# Patient Record
Sex: Female | Born: 1985 | Race: Black or African American | Hispanic: No | Marital: Single | Smoking: Never smoker
Health system: Southern US, Community
[De-identification: ages and names within clinical notes are randomized; demographics above are authoritative.]

## PROBLEM LIST (undated history)

## (undated) DIAGNOSIS — Z87448 Personal history of other diseases of urinary system: Secondary | ICD-10-CM

## (undated) DIAGNOSIS — D649 Anemia, unspecified: Secondary | ICD-10-CM

## (undated) DIAGNOSIS — N6320 Unspecified lump in the left breast, unspecified quadrant: Secondary | ICD-10-CM

## (undated) DIAGNOSIS — B009 Herpesviral infection, unspecified: Secondary | ICD-10-CM

## (undated) DIAGNOSIS — Z862 Personal history of diseases of the blood and blood-forming organs and certain disorders involving the immune mechanism: Secondary | ICD-10-CM

## (undated) DIAGNOSIS — N760 Acute vaginitis: Secondary | ICD-10-CM

## (undated) DIAGNOSIS — J988 Other specified respiratory disorders: Secondary | ICD-10-CM

## (undated) HISTORY — DX: Acute vaginitis: N76.0

## (undated) HISTORY — DX: Unspecified lump in the left breast, unspecified quadrant: N63.20

## (undated) HISTORY — DX: Other specified respiratory disorders: J98.8

## (undated) HISTORY — PX: TYMPANOSTOMY TUBE PLACEMENT: SHX32

## (undated) HISTORY — DX: Herpesviral infection, unspecified: B00.9

## (undated) HISTORY — DX: Personal history of diseases of the blood and blood-forming organs and certain disorders involving the immune mechanism: Z86.2

## (undated) HISTORY — DX: Personal history of other diseases of urinary system: Z87.448

## (undated) HISTORY — DX: Anemia, unspecified: D64.9

## (undated) HISTORY — PX: OTHER SURGICAL HISTORY: SHX169

---
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
## 2011-04-15 ENCOUNTER — Other Ambulatory Visit: Payer: Self-pay | Admitting: Obstetrics and Gynecology

## 2011-04-15 DIAGNOSIS — N6320 Unspecified lump in the left breast, unspecified quadrant: Secondary | ICD-10-CM

## 2011-04-17 ENCOUNTER — Ambulatory Visit
Admission: RE | Admit: 2011-04-17 | Discharge: 2011-04-17 | Disposition: A | Discharge status: Home / Self Care | Payer: BC Managed Care – PPO | Source: Ambulatory Visit | Attending: Obstetrics and Gynecology | Admitting: Obstetrics and Gynecology

## 2011-04-17 ENCOUNTER — Other Ambulatory Visit: Payer: Self-pay

## 2011-04-17 DIAGNOSIS — N6320 Unspecified lump in the left breast, unspecified quadrant: Secondary | ICD-10-CM

## 2012-04-11 ENCOUNTER — Telehealth: Payer: Self-pay | Admitting: Obstetrics and Gynecology

## 2012-04-12 ENCOUNTER — Telehealth: Payer: Self-pay

## 2012-04-12 NOTE — Telephone Encounter (Signed)
vph pt 

## 2012-04-12 NOTE — Telephone Encounter (Addendum)
Lm on vm for pt to call back   Pt states she is having another period after 16 days. Advised pt that since she is not taking any type of birth control that she may experience irregular bleeding and to keep track of how often she is having periods. Pt has annual appointment scheduled 05/02/12 with Dr Pennie Rushing. Advised pt to keep scheduled appointment and to call office back if bleeding becomes heavier or if she experiences any changes different from her normal cycle. Pt voiced understanding.

## 2012-04-13 NOTE — Telephone Encounter (Signed)
Tc to pt per telephone call. Pt took a upt approx 3-4 days ago=negative. Pt informed to keep appt on 05/02/12 for eval. Pt to call back if bldg increases(changing >1pad hourly), fever, or abd pain that doesn't subside with otc pain meds occur. Pt voices understanding.

## 2012-05-02 ENCOUNTER — Encounter: Payer: Self-pay | Admitting: Obstetrics and Gynecology

## 2012-05-02 ENCOUNTER — Other Ambulatory Visit: Payer: Self-pay | Admitting: Obstetrics and Gynecology

## 2012-05-02 ENCOUNTER — Ambulatory Visit (INDEPENDENT_AMBULATORY_CARE_PROVIDER_SITE_OTHER): Payer: BC Managed Care – PPO | Admitting: Obstetrics and Gynecology

## 2012-05-02 VITALS — BP 84/60 | HR 90 | Ht 58.5 in | Wt 88.0 lb

## 2012-05-02 DIAGNOSIS — N926 Irregular menstruation, unspecified: Secondary | ICD-10-CM

## 2012-05-02 DIAGNOSIS — Z124 Encounter for screening for malignant neoplasm of cervix: Secondary | ICD-10-CM

## 2012-05-02 DIAGNOSIS — N898 Other specified noninflammatory disorders of vagina: Secondary | ICD-10-CM

## 2012-05-02 DIAGNOSIS — Z113 Encounter for screening for infections with a predominantly sexual mode of transmission: Secondary | ICD-10-CM

## 2012-05-02 DIAGNOSIS — N760 Acute vaginitis: Secondary | ICD-10-CM | POA: Insufficient documentation

## 2012-05-02 LAB — POCT WET PREP (WET MOUNT): Clue Cells Wet Prep Whiff POC: POSITIVE

## 2012-05-02 LAB — POCT OSOM BVBLUE TEST: Bacterial Vaginosis: POSITIVE

## 2012-05-02 LAB — HEPATITIS B SURFACE ANTIGEN: Hepatitis B Surface Ag: NEGATIVE

## 2012-05-02 LAB — HIV ANTIBODY (ROUTINE TESTING W REFLEX): HIV: NONREACTIVE

## 2012-05-02 MED ORDER — METRONIDAZOLE 0.75 % VA GEL: VAGINAL | Status: DC

## 2012-05-02 MED ORDER — TERCONAZOLE 0.4 % VA CREA: TOPICAL_CREAM | VAGINAL | Status: DC

## 2012-05-02 MED ORDER — FLUCONAZOLE 100 MG PO TABS: ORAL_TABLET | ORAL | Status: DC

## 2012-05-02 NOTE — Patient Instructions (Signed)
Pamphlet for [facility] & MIRENA

## 2012-05-02 NOTE — Progress Notes (Signed)
AEX  Last Pap: 04/08/10, pap due today per last visit note WNL: Yes.  No hx abnl Regular Periods:no Pt states she had 2 periods within 2 weeks, 03/26/12 and 04/10/12 Contraception: none  Monthly Breast exam:yes Tetanus<92yrs:yes Nl.Bladder Function:yes Daily BMs:no Healthy Diet:yes Calcium:no Mammogram:no pt has previously had breast u/s Date of Mammogram: n/a Exercise:yes Have often Exercise: 2-3 times per week  Seatbelt: yes Abuse at home: no Stressful work:yes Sigmoid-colonoscopy: n/a Bone Density: No PCP: [facility] Change in PMH: none Change in UXL:KGMW . Subjective:   Abigail Benton is a 26 y.o. female, G0P0000, who presents for an annual exam. Wants IUD for contraceptiont.  Had irreg menses once.Pt c/o itching and discomfort after intercourse.      History   Social History  . Marital Status: Single    Spouse Name: N/A    Number of Children: N/A  . Years of Education: N/A   Social History Main Topics  . Smoking status: Never Smoker   . Smokeless tobacco: None  . Alcohol Use: No  . Drug Use: No  . Sexually Active: Yes    Birth Control/ Protection: None   Other Topics Concern  . None   Social History Narrative  . None    Menstrual cycle:   LMP: Patient's last menstrual period was 04/10/2012.           Cycle: see above.  Usually q28d  The following portions of the patient's history were reviewed and updated as appropriate: allergies, current medications, past family history, past medical history, past social history, past surgical history and problem list.  Review of Systems Pertinent items are noted in HPI. Breast:Negative for breast lump,nipple discharge or nipple retraction Gastrointestinal: Negative for abdominal pain, change in bowel habits or rectal bleeding Urinary:negative   Objective:    BP 84/60  Pulse 90  Ht 4' 10.5" (1.486 m)  Wt 88 lb (39.917 kg)  BMI 18.08 kg/m2  LMP 04/10/2012    Weight:  Wt Readings from Last 1  Encounters:  05/02/12 88 lb (39.917 kg)          BMI: Body mass index is 18.08 kg/(m^2).  General Appearance: Alert, appropriate appearance for age. No acute distress HEENT: Grossly normal Neck / Thyroid: Supple, no masses, nodes or enlargement Lungs: clear to auscultation bilaterally Back: No CVA tenderness Breast Exam: bilateral fibrocystic changes. L>R.  Know L axillary node on prior US Cardiovascular: Regular rate and rhythm. S1, S2, no murmur Gastrointestinal: Soft, non-tender, no masses or organomegaly Pelvic Exam: External genitalia: normal general appearance Vaginal: normal mucosa without prolapse or lesions and discharge, white and copious Cervix: normal appearance and contact bleeding Adnexa: normal bimanual exam Uterus: normal single, nontender Rectovaginal: normal rectal, no masses Lymphatic Exam: Non-palpable nodes in neck, clavicular, axillary, or inguinal regions Skin: no rash or abnormalities Neurologic: Normal gait and speech, no tremor  Psychiatric: Alert and oriented, appropriate affect.   Wet Prep:positive clue cells, pH 4.5 and positive whiff test osom bv:  Pos osom trich: Neg Urinalysis:not applicable UPT: Negative   Assessment:    BV  Menstrual irregularity only once.  Plan:    pap smear additional lab tests per orders with TSH return annually or prn STD screening: done Contraception:Wants to consider IUD.  Info re Christean Grief and Mirena given Metro[facility]l followed by Denna Haggard PMD

## 2012-05-03 LAB — HSV 2 ANTIBODY, IGG: HSV 2 Glycoprotein G Ab, IgG: <0.1 IV

## 2012-05-03 LAB — HSV 1 ANTIBODY, IGG: HSV 1 Glycoprotein G Ab, IgG: 8.81 IV — ABNORMAL HIGH

## 2012-05-19 ENCOUNTER — Ambulatory Visit (INDEPENDENT_AMBULATORY_CARE_PROVIDER_SITE_OTHER): Payer: BC Managed Care – PPO | Admitting: Obstetrics and Gynecology

## 2012-05-19 ENCOUNTER — Encounter: Payer: Self-pay | Admitting: Obstetrics and Gynecology

## 2012-05-19 VITALS — BP 92/56 | Ht 58.5 in | Wt 90.0 lb

## 2012-05-19 DIAGNOSIS — Z309 Encounter for contraceptive management, unspecified: Secondary | ICD-10-CM

## 2012-05-19 DIAGNOSIS — Z30430 Encounter for insertion of intrauterine contraceptive device: Secondary | ICD-10-CM

## 2012-05-19 LAB — POCT URINE PREGNANCY: Preg Test, Ur: NEGATIVE

## 2012-05-19 NOTE — Progress Notes (Signed)
[name] Insertion:  IUD: [name] LOT#: [id number]  Exp: 02/2013 UPT: negative GC/CHLAMYDIA: negative CONSENT SIGNED: yes DISINFECTION WITH Betadine X3 UTERUS SOUNDED AT 8 CM IUD INSERTED PER PROTOCOL: yes COMPLICATION: none PATIENT INSTRUCTED TO CALL IS FEVER OR ABNORMAL PAIN:yes PATIENT INSTRUCTED ON HOW TO CHECK IUD STRINGS: yes FOLLOW UP APPT: 6wks  800 mg Ibuprofen Given.   Dierdre Forth MD

## 2012-05-20 MED ORDER — LEVONORGESTREL 13.5 MG IU IUD
1.0000 | INTRAUTERINE_SYSTEM | Freq: Once | INTRAUTERINE | Status: AC
  Administered 2012-05-20: 1 via INTRAUTERINE

## 2012-06-07 ENCOUNTER — Telehealth: Payer: Self-pay | Admitting: Obstetrics and Gynecology

## 2012-06-07 NOTE — Telephone Encounter (Signed)
Triage/res. °

## 2012-06-07 NOTE — Telephone Encounter (Signed)
Pt calling for STD results. 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 Advised pt that all other tests were negative. Pt voiced understanding.

## 2012-06-30 ENCOUNTER — Ambulatory Visit (INDEPENDENT_AMBULATORY_CARE_PROVIDER_SITE_OTHER): Payer: BC Managed Care – PPO | Admitting: Obstetrics and Gynecology

## 2012-06-30 ENCOUNTER — Encounter: Payer: Self-pay | Admitting: Obstetrics and Gynecology

## 2012-06-30 VITALS — BP 98/58 | Ht 58.5 in | Wt 93.0 lb

## 2012-06-30 DIAGNOSIS — N939 Abnormal uterine and vaginal bleeding, unspecified: Secondary | ICD-10-CM

## 2012-06-30 NOTE — Progress Notes (Signed)
SKYLA F/U INSERTED ON 05/19/12  Complaints: Not able to time period. No further bleeding for the last several weeks.  Has cramps off and on relieved by Aleve just before the onset of this cycle Strings Visualized:yes Normal Bimanual:yes Other: NO C/O except as above Follow up with AEX:yes Other: instructed to call for unusual abdominal pain or bleeding after 3 months

## 2012-12-30 ENCOUNTER — Other Ambulatory Visit: Payer: Self-pay | Admitting: Obstetrics and Gynecology

## 2013-12-28 ENCOUNTER — Ambulatory Visit (INDEPENDENT_AMBULATORY_CARE_PROVIDER_SITE_OTHER): Payer: BC Managed Care – PPO | Admitting: Nurse Practitioner

## 2013-12-28 ENCOUNTER — Encounter (INDEPENDENT_AMBULATORY_CARE_PROVIDER_SITE_OTHER): Payer: Self-pay

## 2013-12-28 ENCOUNTER — Encounter: Payer: Self-pay | Admitting: Nurse Practitioner

## 2013-12-28 VITALS — BP 110/70 | HR 85 | Temp 98.3°F | Ht <= 58 in | Wt 96.8 lb

## 2013-12-28 DIAGNOSIS — N390 Urinary tract infection, site not specified: Secondary | ICD-10-CM

## 2013-12-28 DIAGNOSIS — R319 Hematuria, unspecified: Secondary | ICD-10-CM

## 2013-12-28 LAB — POCT UA - MICROSCOPIC ONLY
Bacteria, U Microscopic: NEGATIVE
CRYSTALS, UR, HPF, POC: NEGATIVE
Casts, Ur, LPF, POC: NEGATIVE
Mucus, UA: NEGATIVE
Yeast, UA: NEGATIVE

## 2013-12-28 LAB — POCT URINALYSIS DIPSTICK
Bilirubin, UA: NEGATIVE
GLUCOSE UA: NEGATIVE
Ketones, UA: NEGATIVE
NITRITE UA: NEGATIVE
Protein, UA: NEGATIVE
Spec Grav, UA: 1.01
UROBILINOGEN UA: NEGATIVE
pH, UA: 7.5

## 2013-12-28 MED ORDER — NITROFURANTOIN MONOHYD MACRO 100 MG PO CAPS: 100.0000 mg | ORAL_CAPSULE | Freq: Two times a day (BID) | ORAL | Status: DC

## 2013-12-28 NOTE — Progress Notes (Signed)
   Subjective:    Patient ID: Abigail MaidensBrianna J Benton, female    DOB: 03-27-1986, 28 y.o.   MRN: [id number]  HPI Patient in c/o discoloration of urine- started Tuesday- No dysuria or frequency-    Review of Systems  Genitourinary: Positive for urgency, frequency and flank pain. Negative for dysuria.  All other systems reviewed and are negative.       Objective:   Physical Exam  Constitutional: She appears well-developed and well-nourished.  Cardiovascular: Normal rate, regular rhythm and normal heart sounds.   Pulmonary/Chest: Effort normal and breath sounds normal.  Abdominal: Soft.  Skin: Skin is warm and dry.  Psychiatric: She has a normal mood and affect. Her behavior is normal. Judgment and thought content normal.    BP 110/70  Pulse 85  Temp(Src) 98.3 F (36.8 C) (Oral)  Ht 4\' 10"  (1.473 m)  Wt 96 lb 12.8 oz (43.908 kg)  BMI 20.24 kg/m2  LMP 12/22/2013 Results for orders placed in visit on 12/28/13  POCT URINALYSIS DIPSTICK      Result Value Ref Range   Color, UA straw     Clarity, UA cloudy     Glucose, UA neg     Bilirubin, UA neg     Ketones, UA neg     Spec Grav, UA 1.010     Blood, UA large     pH, UA 7.5     Protein, UA neg     Urobilinogen, UA negative     Nitrite, UA neg     Leukocytes, UA moderate (2+)    POCT UA - MICROSCOPIC ONLY      Result Value Ref Range   WBC, Ur, HPF, POC 10-15     RBC, urine, microscopic 15-20     Bacteria, U Microscopic neg     Mucus, UA neg     Epithelial cells, urine per micros occ     Crystals, Ur, HPF, POC neg     Casts, Ur, LPF, POC neg     Yeast, UA neg           Assessment & Plan:   1. Hematuria   2. UTI (urinary tract infection)    Meds ordered this encounter  Medications  . drospirenone-ethinyl estradiol (YAZ,GIANVI,LORYNA) 3-0.02 MG tablet    Sig: Take 1 tablet by mouth daily.  . nitrofurantoin, macrocrystal-monohydrate, (MACROBID) 100 MG capsule    Sig: Take 1 capsule (100 mg total) by mouth 2  (two) times daily.    Dispense:  14 capsule    Refill:  0    Order Specific Question:  Supervising Provider    Answer:  Deborra MedinaMOORE, DONALD W [[id number]]   Force fluids AZO over the counter X2 days RTO prn Culture pending  Mary-Margaret Daphine DeutscherMartin, FNP

## 2013-12-28 NOTE — Patient Instructions (Signed)
Urinary Tract Infection  Urinary tract infections (UTIs) can develop anywhere along your urinary tract. Your urinary tract is your body's drainage system for removing wastes and extra water. Your urinary tract includes two kidneys, two ureters, a bladder, and a urethra. Your kidneys are a pair of bean-shaped organs. Each kidney is about the size of your fist. They are located below your ribs, one on each side of your spine.  CAUSES  Infections are caused by microbes, which are microscopic organisms, including fungi, viruses, and bacteria. These organisms are so small that they can only be seen through a microscope. Bacteria are the microbes that most commonly cause UTIs.  SYMPTOMS   Symptoms of UTIs may vary by age and gender of the patient and by the location of the infection. Symptoms in young women typically include a frequent and intense urge to urinate and a painful, burning feeling in the bladder or urethra during urination. Older women and men are more likely to be tired, shaky, and weak and have muscle aches and abdominal pain. A fever may mean the infection is in your kidneys. Other symptoms of a kidney infection include pain in your back or sides below the ribs, nausea, and vomiting.  DIAGNOSIS  To diagnose a UTI, your caregiver will ask you about your symptoms. Your caregiver also will ask to provide a urine sample. The urine sample will be tested for bacteria and white blood cells. White blood cells are made by your body to help fight infection.  TREATMENT   Typically, UTIs can be treated with medication. Because most UTIs are caused by a bacterial infection, they usually can be treated with the use of antibiotics. The choice of antibiotic and length of treatment depend on your symptoms and the type of bacteria causing your infection.  HOME CARE INSTRUCTIONS   If you were prescribed antibiotics, take them exactly as your caregiver instructs you. Finish the medication even if you feel better after you  have only taken some of the medication.   Drink enough water and fluids to keep your urine clear or pale yellow.   Avoid caffeine, tea, and carbonated beverages. They tend to irritate your bladder.   Empty your bladder often. Avoid holding urine for long periods of time.   Empty your bladder before and after sexual intercourse.   After a bowel movement, women should cleanse from front to back. Use each tissue only once.  SEEK MEDICAL CARE IF:    You have back pain.   You develop a fever.   Your symptoms do not begin to resolve within 3 days.  SEEK IMMEDIATE MEDICAL CARE IF:    You have severe back pain or lower abdominal pain.   You develop chills.   You have nausea or vomiting.   You have continued burning or discomfort with urination.  MAKE SURE YOU:    Understand these instructions.   Will watch your condition.   Will get help right away if you are not doing well or get worse.  Document Released: 07/01/2005 Document Revised: 03/22/2012 Document Reviewed: 10/30/2011  [employer] Patient Information 2014 [employer].

## 2014-01-09 ENCOUNTER — Encounter: Payer: Self-pay | Admitting: Family Medicine

## 2014-01-09 ENCOUNTER — Ambulatory Visit (INDEPENDENT_AMBULATORY_CARE_PROVIDER_SITE_OTHER): Payer: BC Managed Care – PPO | Admitting: Family Medicine

## 2014-01-09 VITALS — BP 92/56 | HR 77 | Temp 97.8°F | Ht <= 58 in | Wt 93.6 lb

## 2014-01-09 DIAGNOSIS — R350 Frequency of micturition: Secondary | ICD-10-CM

## 2014-01-09 DIAGNOSIS — IMO0001 Reserved for inherently not codable concepts without codable children: Secondary | ICD-10-CM | POA: Insufficient documentation

## 2014-01-09 DIAGNOSIS — N390 Urinary tract infection, site not specified: Secondary | ICD-10-CM

## 2014-01-09 LAB — POCT URINALYSIS DIPSTICK
Bilirubin, UA: NEGATIVE
Blood, UA: NEGATIVE
Glucose, UA: NEGATIVE
Ketones, UA: NEGATIVE
Nitrite, UA: NEGATIVE
Spec Grav, UA: 1.01
Urobilinogen, UA: NEGATIVE
pH, UA: 6

## 2014-01-09 LAB — POCT UA - MICROSCOPIC ONLY
Casts, Ur, LPF, POC: NEGATIVE
Crystals, Ur, HPF, POC: NEGATIVE
Mucus, UA: NEGATIVE
Yeast, UA: NEGATIVE

## 2014-01-09 NOTE — Addendum Note (Signed)
Addended by: Orma RenderHODGES, Pra[place] Stenberg F on: 01/09/2014 04:24 PM   Modules accepted: Orders

## 2014-01-09 NOTE — Progress Notes (Signed)
Patient ID: Abigail Benton, female   DOB: 27-Nov-1985, 27 y.o.   MRN: [id number] SUBJECTIVE: CC: Chief Complaint  Patient presents with  . Follow-up    follow up UTI that was initially treated by Sheron Nightingale, NP stated has no UTI symptoms wants to make sure it is cleared . states saw her gyn last week  and had STD and was told "clear " and l"labs drawn and normal " per pt    HPI: Has had a UTI almost 2 weeks ago. But only took 3 doses of nitrofurantoin and it made her vomit and she was intolerant. No UTI symptoms but was told to come in to check urine for clearance. She is  Sexually active without use of barrier method. No vaginal discharge. No STIs. She had her GYN exam last week with STI screens  Negative.  Past Medical History  Diagnosis Date  . Left breast mass     stable in 04/2011  . Infection, respiratory     2011  . Vulvovaginitis   . H/O hematuria   . H/O sickle cell trait   . Anemia   . HSV-1 (herpes simplex virus 1) infection    Past Surgical History  Procedure Laterality Date  . Tympanostomy tube placement      bilateral   History   Social History  . Marital Status: Single    Spouse Name: N/A    Number of Children: N/A  . Years of Education: N/A   Occupational History  . Not on file.   Social History Main Topics  . Smoking status: Never Smoker   . Smokeless tobacco: Never Used  . Alcohol Use: No  . Drug Use: No  . Sexual Activity: Yes    Birth Control/ Protection: IUD     Comment: skylar   Other Topics Concern  . Not on file   Social History Narrative  . No narrative on file   Family History  Problem Relation Age of Onset  . Cancer Maternal Grandmother     breast  . Cancer Maternal Aunt     breast  . Cancer Maternal Grandfather    Current Outpatient Prescriptions on File Prior to Visit  Medication Sig Dispense Refill  . drospirenone-ethinyl estradiol (YAZ,GIANVI,LORYNA) 3-0.02 MG tablet Take 1 tablet by mouth daily.      . nitrofurantoin,  macrocrystal-monohydrate, (MACROBID) 100 MG capsule Take 1 capsule (100 mg total) by mouth 2 (two) times daily.  14 capsule  0   No current facility-administered medications on file prior to visit.   Allergies  Allergen Reactions  . Macrobid [employer] Macro]   . Latex Rash    There is no immunization history on file for this patient. Prior to Admission medications   Medication Sig Start Date End Date Taking? Authorizing Provider  drospirenone-ethinyl estradiol (YAZ,GIANVI,LORYNA) 3-0.02 MG tablet Take 1 tablet by mouth daily.    Historical Provider, MD  nitrofurantoin, macrocrystal-monohydrate, (MACROBID) 100 MG capsule Take 1 capsule (100 mg total) by mouth 2 (two) times daily. 12/28/13   Mary-Margaret Daphine Deutscher, FNP     ROS: As above in the HPI. All other systems are stable or negative.  OBJECTIVE: APPEARANCE:  Patient in no acute distress.The patient appeared well nourished and normally developed. Acyanotic. Waist: VITAL SIGNS:BP 92/56  Pulse 77  Temp(Src) 97.8 F (36.6 C) (Oral)  Ht 4\' 10"  (1.473 m)  Wt 93 lb 9.6 oz (42.457 kg)  BMI 19.57 kg/m2  LMP 12/29/2013   SKIN: warm and  Dry without overt rashes, tattoos and scars  HEAD and Neck: without JVD, Head and scalp: normal Eyes:No scleral icterus. Fundi normal, eye movements normal. Ears: Auricle normal, canal normal, Tympanic membranes normal, insufflation normal. Nose: normal Throat: normal Neck & thyroid: normal  CHEST & LUNGS: Chest wall: normal Lungs: Clear  CVS: Reveals the PMI to be normally located. Regular rhythm, First and Second Heart sounds are normal,  absence of murmurs, rubs or gallops. Peripheral vasculature: Radial pulses: normal Dorsal pedis pulses: normal Posterior pulses: normal  ABDOMEN:  Appearance: normal Benign, no organomegaly, no masses, no Abdominal Aortic enlargement. No Guarding , no rebound. No Bruits. Bowel sounds: normal  RECTAL: N/A GU: N/A  EXTREMETIES:  nonedematous.  MUSCULOSKELETAL:  Spine: normal Joints: intact  NEUROLOGIC: oriented to time,place and person; nonfocal. Strength is normal Sensory is normal Reflexes are normal Cranial Nerves are normal.  ASSESSMENT: Frequency - Plan: POCT UA - Microscopic Only, POCT urinalysis dipstick  UTI (urinary tract infection)  PLAN: Results for orders placed in visit on 12/28/13  POCT URINALYSIS DIPSTICK      Result Value Ref Range   Color, UA straw     Clarity, UA cloudy     Glucose, UA neg     Bilirubin, UA neg     Ketones, UA neg     Spec Grav, UA 1.010     Blood, UA large     pH, UA 7.5     Protein, UA neg     Urobilinogen, UA negative     Nitrite, UA neg     Leukocytes, UA moderate (2+)    POCT UA - MICROSCOPIC ONLY      Result Value Ref Range   WBC, Ur, HPF, POC 10-15     RBC, urine, microscopic 15-20     Bacteria, U Microscopic neg     Mucus, UA neg     Epithelial cells, urine per micros occ     Crystals, Ur, HPF, POC neg     Casts, Ur, LPF, POC neg     Yeast, UA neg      Orders Placed This Encounter  Procedures  . POCT UA - Microscopic Only  . POCT urinalysis dipstick   No orders of the defined types were placed in this encounter.   There are no discontinued medications. Return if symptoms worsen or fail to improve, for pending Urine culture. Urine culture ordered in lab.  Ayo Guarino P. Modesto CharonWong, M.D.

## 2014-01-11 LAB — URINE CULTURE: Organism ID, Bacteria: NO GROWTH

## 2014-01-14 ENCOUNTER — Other Ambulatory Visit: Payer: Self-pay | Admitting: Family Medicine

## 2014-01-14 DIAGNOSIS — N390 Urinary tract infection, site not specified: Secondary | ICD-10-CM

## 2015-06-17 ENCOUNTER — Other Ambulatory Visit: Payer: Self-pay | Admitting: Obstetrics and Gynecology

## 2015-06-17 DIAGNOSIS — N6310 Unspecified lump in the right breast, unspecified quadrant: Secondary | ICD-10-CM

## 2015-06-19 ENCOUNTER — Ambulatory Visit
Admission: RE | Admit: 2015-06-19 | Discharge: 2015-06-19 | Disposition: A | Discharge status: Home / Self Care | Payer: BC Managed Care – PPO | Source: Ambulatory Visit | Attending: Obstetrics and Gynecology | Admitting: Obstetrics and Gynecology

## 2015-06-19 DIAGNOSIS — N6310 Unspecified lump in the right breast, unspecified quadrant: Secondary | ICD-10-CM

## 2015-07-22 ENCOUNTER — Other Ambulatory Visit: Payer: Self-pay | Admitting: Obstetrics and Gynecology

## 2015-07-22 DIAGNOSIS — N6310 Unspecified lump in the right breast, unspecified quadrant: Secondary | ICD-10-CM

## 2015-07-29 ENCOUNTER — Ambulatory Visit
Admission: RE | Admit: 2015-07-29 | Discharge: 2015-07-29 | Disposition: A | Discharge status: Home / Self Care | Payer: BC Managed Care – PPO | Source: Ambulatory Visit | Attending: Obstetrics and Gynecology | Admitting: Obstetrics and Gynecology

## 2015-07-29 DIAGNOSIS — N6310 Unspecified lump in the right breast, unspecified quadrant: Secondary | ICD-10-CM

## 2016-01-14 ENCOUNTER — Encounter (INDEPENDENT_AMBULATORY_CARE_PROVIDER_SITE_OTHER): Payer: Self-pay

## 2016-01-14 ENCOUNTER — Encounter: Payer: Self-pay | Admitting: *Deleted

## 2016-01-14 ENCOUNTER — Other Ambulatory Visit: Payer: BC Managed Care – PPO

## 2016-01-14 DIAGNOSIS — B351 Tinea unguium: Secondary | ICD-10-CM

## 2016-01-15 LAB — HEPATIC FUNCTION PANEL
ALK PHOS: 48 IU/L (ref 39–117)
ALT: 14 IU/L (ref 0–32)
AST: 18 IU/L (ref 0–40)
Albumin: 3.8 g/dL (ref 3.5–5.5)
Bilirubin Total: 0.4 mg/dL (ref 0.0–1.2)
Bilirubin, Direct: 0.11 mg/dL (ref 0.00–0.40)
Total Protein: 6.7 g/dL (ref 6.0–8.5)

## 2018-10-24 ENCOUNTER — Encounter: Payer: Self-pay | Admitting: Obstetrics and Gynecology

## 2018-11-25 ENCOUNTER — Other Ambulatory Visit: Payer: Self-pay | Admitting: Obstetrics and Gynecology

## 2018-11-25 DIAGNOSIS — N6310 Unspecified lump in the right breast, unspecified quadrant: Secondary | ICD-10-CM

## 2018-11-28 ENCOUNTER — Other Ambulatory Visit: Payer: Self-pay | Admitting: Obstetrics and Gynecology

## 2018-11-28 ENCOUNTER — Other Ambulatory Visit: Payer: Self-pay

## 2018-11-28 DIAGNOSIS — N6310 Unspecified lump in the right breast, unspecified quadrant: Secondary | ICD-10-CM

## 2018-11-30 ENCOUNTER — Ambulatory Visit
Admission: RE | Admit: 2018-11-30 | Discharge: 2018-11-30 | Disposition: A | Discharge status: Home / Self Care | Payer: BC Managed Care – PPO | Source: Ambulatory Visit | Attending: Obstetrics and Gynecology | Admitting: Obstetrics and Gynecology

## 2018-11-30 ENCOUNTER — Ambulatory Visit: Payer: BC Managed Care – PPO

## 2018-11-30 DIAGNOSIS — N6310 Unspecified lump in the right breast, unspecified quadrant: Secondary | ICD-10-CM

## 2018-12-01 ENCOUNTER — Ambulatory Visit
Admission: RE | Admit: 2018-12-01 | Discharge: 2018-12-01 | Disposition: A | Discharge status: Home / Self Care | Payer: BC Managed Care – PPO | Source: Ambulatory Visit | Attending: Obstetrics and Gynecology | Admitting: Obstetrics and Gynecology

## 2018-12-01 DIAGNOSIS — N6310 Unspecified lump in the right breast, unspecified quadrant: Secondary | ICD-10-CM

## 2019-11-30 ENCOUNTER — Ambulatory Visit: Payer: BC Managed Care – PPO | Attending: Family

## 2019-11-30 ENCOUNTER — Ambulatory Visit: Payer: BC Managed Care – PPO

## 2019-11-30 DIAGNOSIS — Z23 Encounter for immunization: Secondary | ICD-10-CM | POA: Insufficient documentation

## 2019-11-30 NOTE — Progress Notes (Signed)
   Covid-19 Vaccination Clinic  Name:  [name]    MRN: [id number] DOB: [birth year]  11/30/2019  [name] was observed post Covid-19 immunization for 15 minutes without incidence. She was provided with Vaccine Information Sheet and instruction to access the V-Safe system.   [name] was instructed to call 911 with any severe reactions post vaccine: Marland Kitchen Difficulty breathing  . Swelling of your face and throat  . A fast heartbeat  . A bad rash all over your body  . Dizziness and weakness    Immunizations Administered    Name Date Dose VIS Date Route   Moderna COVID-19 Vaccine 11/30/2019 10:49 AM 0.5 mL 09/05/2019 Intramuscular   Manufacturer: Moderna   Lot: [id number]   NDC: 72072-182-88

## 2019-12-28 ENCOUNTER — Other Ambulatory Visit: Payer: Self-pay | Admitting: Obstetrics and Gynecology

## 2019-12-28 DIAGNOSIS — N6310 Unspecified lump in the right breast, unspecified quadrant: Secondary | ICD-10-CM

## 2020-01-02 ENCOUNTER — Ambulatory Visit: Payer: BC Managed Care – PPO | Attending: Family

## 2020-01-02 DIAGNOSIS — Z23 Encounter for immunization: Secondary | ICD-10-CM

## 2020-01-02 NOTE — Progress Notes (Signed)
   Covid-19 Vaccination Clinic  Name:  Abigail Benton    MRN: [id number] DOB: 1986/07/02  01/02/2020  Abigail Benton was observed post Covid-19 immunization for 15 minutes without incident. She was provided with Vaccine Information Sheet and instruction to access the V-Safe system.   Abigail Benton was instructed to call 911 with any severe reactions post vaccine: Marland Kitchen Difficulty breathing  . Swelling of face and throat  . A fast heartbeat  . A bad rash all over body  . Dizziness and weakness   Immunizations Administered    Name Date Dose VIS Date Route   Moderna COVID-19 Vaccine 01/02/2020 12:04 PM 0.5 mL 09/05/2019 Intramuscular   Manufacturer: Moderna   Lot: [id number]   NDC: 56314-970-26

## 2020-01-15 ENCOUNTER — Ambulatory Visit
Admission: RE | Admit: 2020-01-15 | Discharge: 2020-01-15 | Disposition: A | Discharge status: Home / Self Care | Payer: BC Managed Care – PPO | Source: Ambulatory Visit | Attending: Obstetrics and Gynecology | Admitting: Obstetrics and Gynecology

## 2020-01-15 ENCOUNTER — Other Ambulatory Visit: Payer: Self-pay

## 2020-01-15 DIAGNOSIS — N6310 Unspecified lump in the right breast, unspecified quadrant: Secondary | ICD-10-CM

## 2020-08-23 IMAGING — US US BREAST*R* LIMITED INC AXILLA
1 series · 1 of 1 positions shown · non-contrast
Comparison: Previous exam(s).

CLINICAL DATA: Patient complains of a palpable abnormality in the
upper-outer quadrant of the right breast. Patient states that is
been clinically stable for several years.

EXAM:
DIGITAL DIAGNOSTIC BILATERAL MAMMOGRAM WITH CAD AND TOMO
ULTRASOUND RIGHT BREAST

[Series 1: us breast*right* limited inc axilla · 0.04mm/px · 1 of 1 slices shown]
[im 1/1]
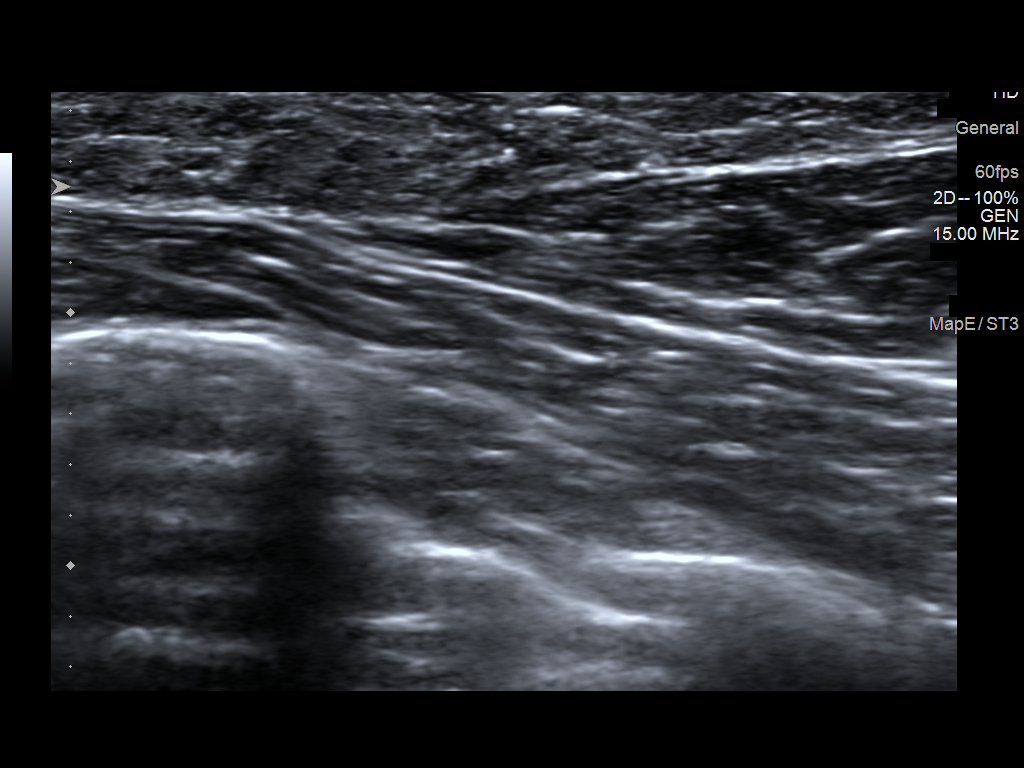

[1 of 1 positions shown; findings below may reference images not displayed]

ACR Breast Density Category d: The breast tissue is extremely dense,
which lowers the sensitivity of mammography.
FINDINGS: No suspicious mass, malignant type microcalcifications or distortion
detected in either breast. Spot tangential view of the area of
clinical concern in the upper-outer quadrant of the right breast
shows normal fibroglandular tissue.

Mammographic images were processed with CAD.

On physical exam, I palpate soft mobile thickening in the right
breast at 10 o'clock 5 cm from the nipple. Patient states that it is
clinically stable since at least 7584.

Targeted ultrasound is performed, showing normal tissue in the area
of clinical concern in the right breast at 10 o'clock 5 cm from the
nipple.
IMPRESSION: No evidence of malignancy in either breast.

RECOMMENDATION:
If the clinical exam remains benign/stable screening mammography can
be deferred until the age of 40. The importance of self-breast
examination was discussed with the patient.

I have discussed the findings and recommendations with the patient.
If applicable, a reminder letter will be sent to the patient
regarding the next appointment.

BI-RADS CATEGORY  1: Negative.

## 2020-08-23 IMAGING — MG DIGITAL DIAGNOSTIC BILAT W/ TOMO W/ CAD
6 of 10 series · 6 of 30 positions shown · non-contrast
Comparison: Previous exam(s).

CLINICAL DATA: Patient complains of a palpable abnormality in the
upper-outer quadrant of the right breast. Patient states that is
been clinically stable for several years.

EXAM:
DIGITAL DIAGNOSTIC BILATERAL MAMMOGRAM WITH CAD AND TOMO
ULTRASOUND RIGHT BREAST

[R CC synth-2D]
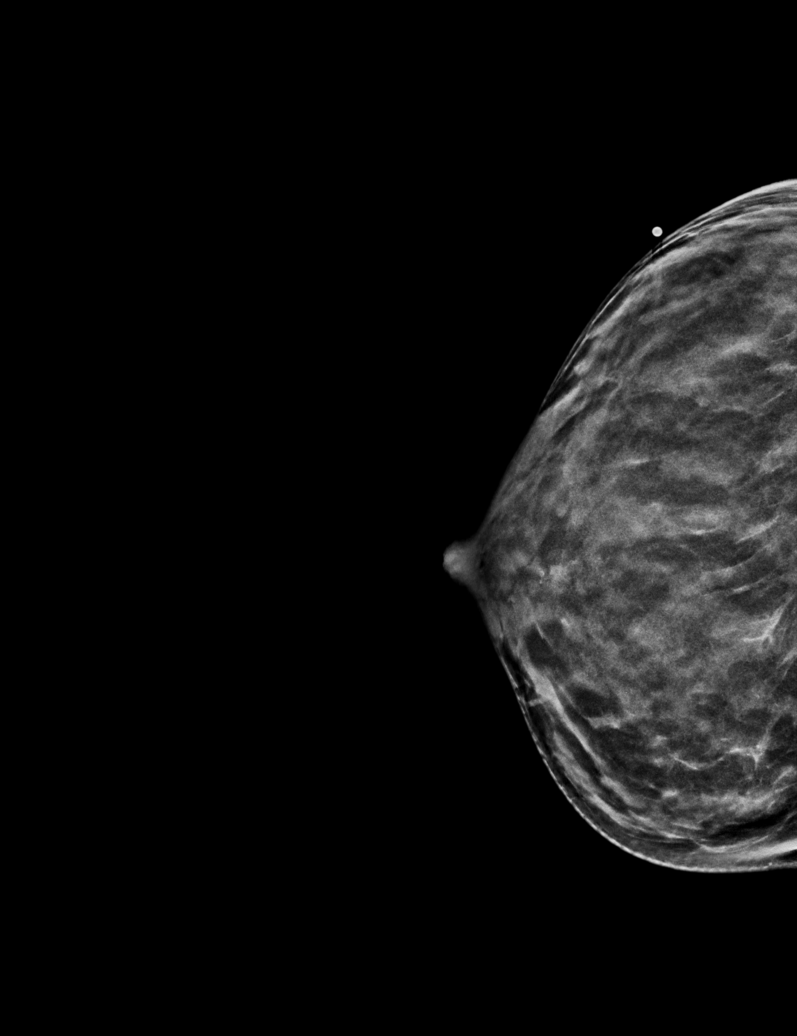

[L MLO synth-2D]
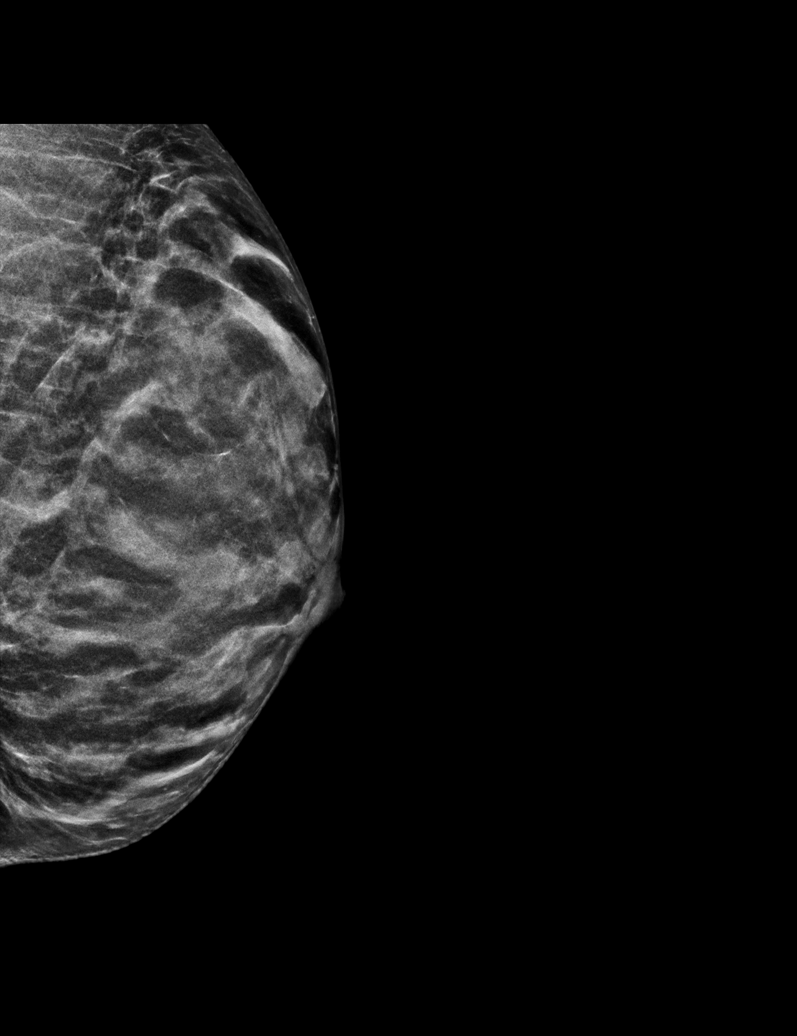

[R MLO synth-2D]
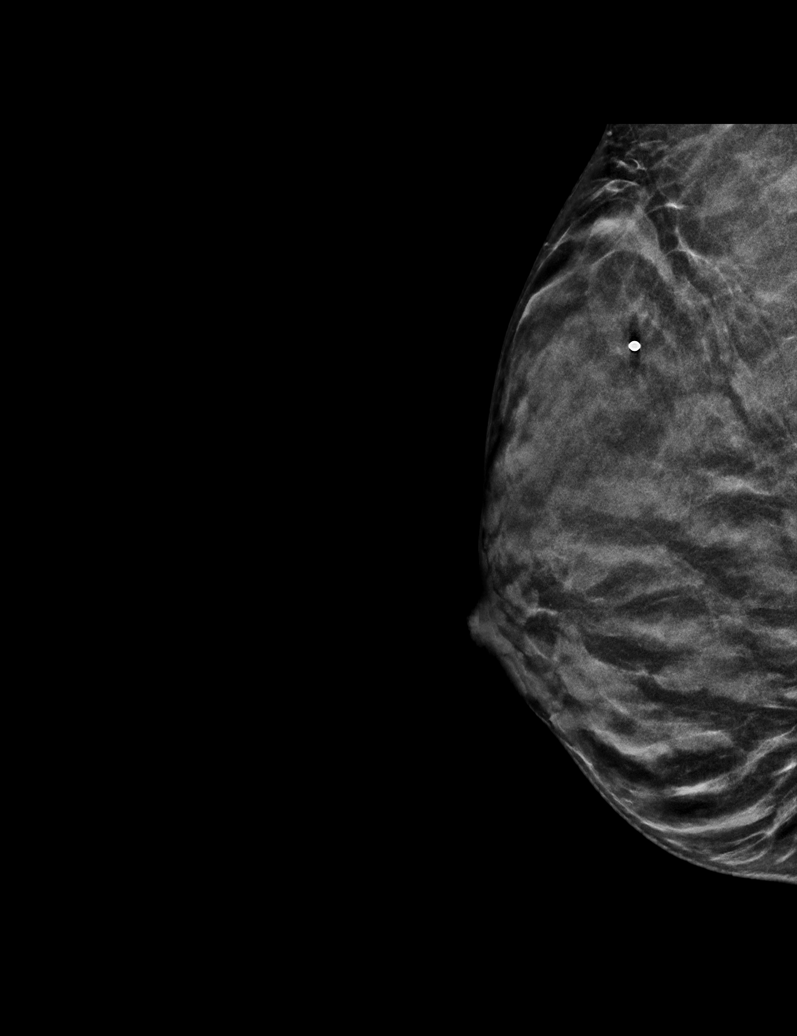

[L CC synth-2D]
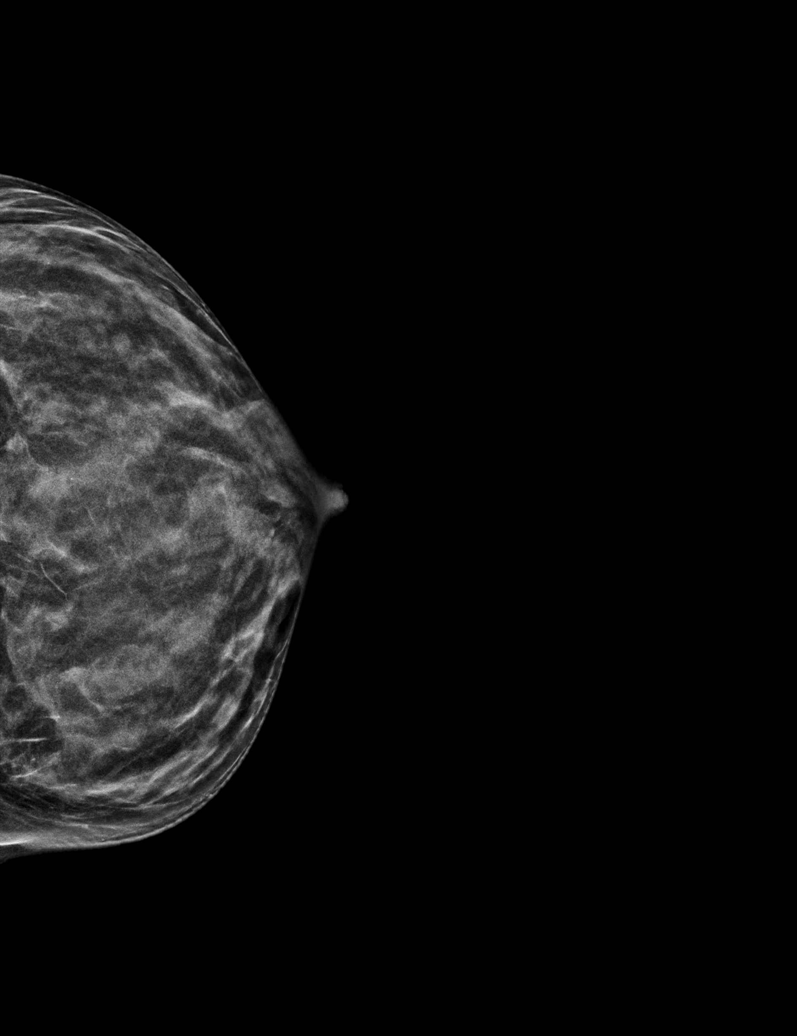

[R XCCL synth-2D]
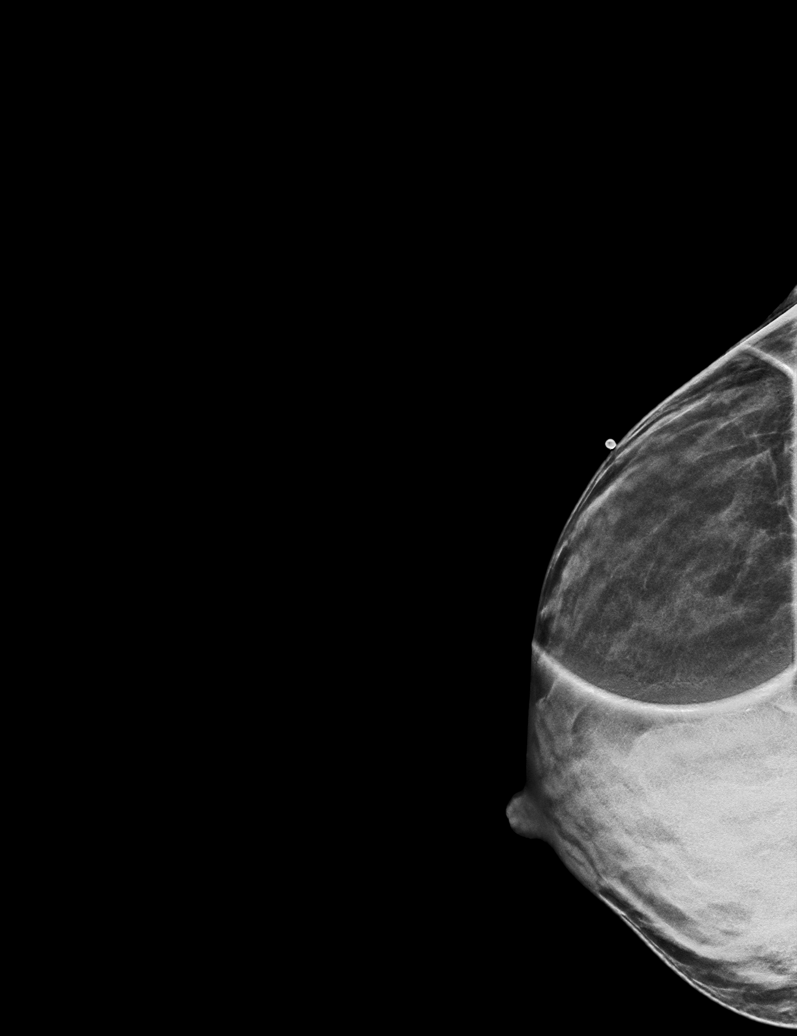

[L CC tomo · tomo slice 21/42.0]
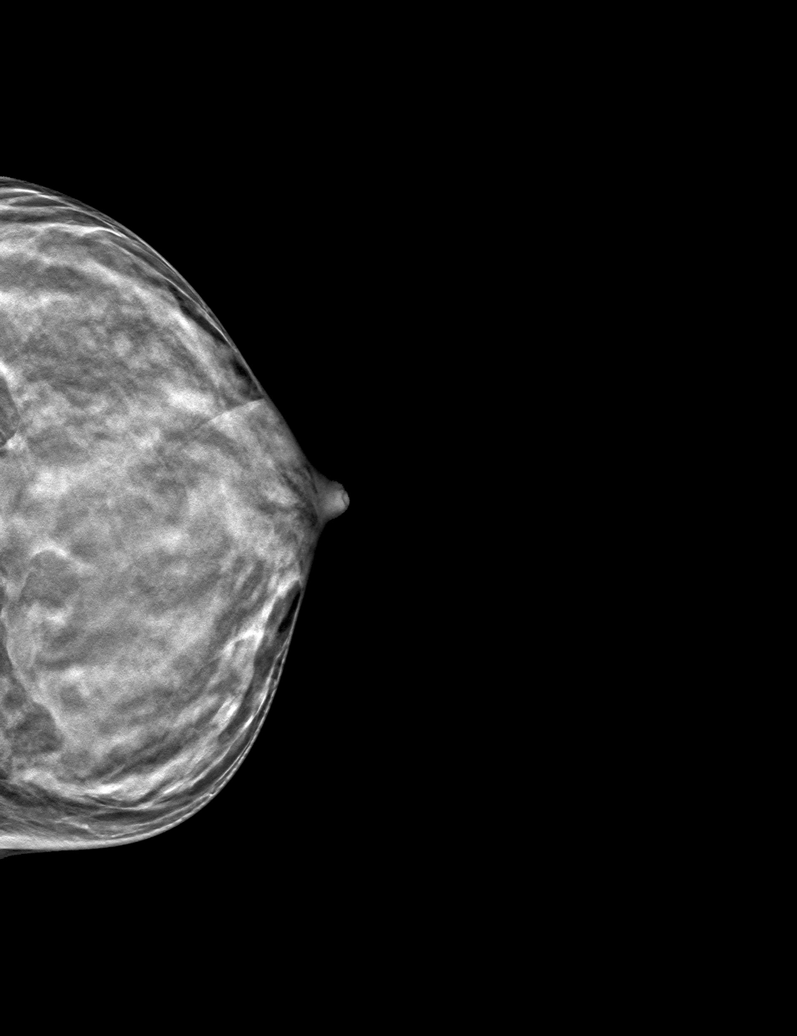

[6 of 30 positions shown; findings below may reference images not displayed]

ACR Breast Density Category d: The breast tissue is extremely dense,
which lowers the sensitivity of mammography.
FINDINGS: No suspicious mass, malignant type microcalcifications or distortion
detected in either breast. Spot tangential view of the area of
clinical concern in the upper-outer quadrant of the right breast
shows normal fibroglandular tissue.

Mammographic images were processed with CAD.

On physical exam, I palpate soft mobile thickening in the right
breast at 10 o'clock 5 cm from the nipple. Patient states that it is
clinically stable since at least 7584.

Targeted ultrasound is performed, showing normal tissue in the area
of clinical concern in the right breast at 10 o'clock 5 cm from the
nipple.
IMPRESSION: No evidence of malignancy in either breast.

RECOMMENDATION:
If the clinical exam remains benign/stable screening mammography can
be deferred until the age of 40. The importance of self-breast
examination was discussed with the patient.

I have discussed the findings and recommendations with the patient.
If applicable, a reminder letter will be sent to the patient
regarding the next appointment.

BI-RADS CATEGORY  1: Negative.

## 2021-05-12 ENCOUNTER — Other Ambulatory Visit: Payer: Self-pay

## 2021-05-12 ENCOUNTER — Emergency Department (HOSPITAL_BASED_OUTPATIENT_CLINIC_OR_DEPARTMENT_OTHER)
Admission: EM | Admit: 2021-05-12 | Discharge: 2021-05-12 | Disposition: A | Discharge status: Home / Self Care | Payer: BC Managed Care – PPO | Attending: Emergency Medicine | Admitting: Emergency Medicine

## 2021-05-12 ENCOUNTER — Encounter (HOSPITAL_BASED_OUTPATIENT_CLINIC_OR_DEPARTMENT_OTHER): Payer: Self-pay

## 2021-05-12 DIAGNOSIS — Z23 Encounter for immunization: Secondary | ICD-10-CM | POA: Diagnosis not present

## 2021-05-12 DIAGNOSIS — S61210A Laceration without foreign body of right index finger without damage to nail, initial encounter: Secondary | ICD-10-CM | POA: Diagnosis not present

## 2021-05-12 DIAGNOSIS — W268XXA Contact with other sharp object(s), not elsewhere classified, initial encounter: Secondary | ICD-10-CM | POA: Diagnosis not present

## 2021-05-12 DIAGNOSIS — Y9389 Activity, other specified: Secondary | ICD-10-CM | POA: Diagnosis not present

## 2021-05-12 DIAGNOSIS — Z91040 Latex allergy status: Secondary | ICD-10-CM | POA: Insufficient documentation

## 2021-05-12 DIAGNOSIS — S6991XA Unspecified injury of right wrist, hand and finger(s), initial encounter: Secondary | ICD-10-CM | POA: Diagnosis present

## 2021-05-12 MED ORDER — TETANUS-DIPHTH-ACELL PERTUSSIS 5-2.5-18.5 LF-MCG/0.5 IM SUSY
0.5000 mL | PREFILLED_SYRINGE | Freq: Once | INTRAMUSCULAR | Status: AC
  Administered 2021-05-12: 0.5 mL via INTRAMUSCULAR
  Filled 2021-05-12: qty 0.5

## 2021-05-12 MED ORDER — CEPHALEXIN 250 MG PO CAPS
500.0000 mg | ORAL_CAPSULE | Freq: Once | ORAL | Status: AC
  Administered 2021-05-12: 500 mg via ORAL
  Filled 2021-05-12: qty 2

## 2021-05-12 MED ORDER — CEPHALEXIN 500 MG PO CAPS: 500.0000 mg | ORAL_CAPSULE | Freq: Two times a day (BID) | ORAL | 0 refills | Status: AC

## 2021-05-12 NOTE — ED Notes (Signed)
Pt's finger cleaned, bacitracin applied, and bandaged.

## 2021-05-12 NOTE — ED Triage Notes (Signed)
Slicing carrot last night and accidentally cut the tip of her right index finger.  No active bleeding noted.

## 2021-05-12 NOTE — ED Triage Notes (Signed)
Slicing carrot last night and accidentally cut her the tip of her right index finger.

## 2021-05-12 NOTE — ED Provider Notes (Signed)
[facility] EMERGENCY DEPT Provider Note   CSN: [id number] Arrival date & time: 05/12/21  1006     History Chief Complaint  Patient presents with   Laceration    Abigail Benton is a 35 y.o. female.  Patient cut the tip of her right index finger on slicer.  Not bleeding.  Tetanus shot not up-to-date.  The history is provided by the patient.  Laceration Location: right index finger. Bleeding: controlled   Laceration mechanism:  Metal edge Pain details:    Quality:  Aching   Severity:  No pain Relieved by:  Nothing Worsened by:  Nothing Tetanus status:  Unknown Associated symptoms: no fever, no focal weakness, no numbness, no rash, no redness, no swelling and no streaking       Past Medical History:  Diagnosis Date   Anemia    H/O hematuria    H/O sickle cell trait    HSV-1 (herpes simplex virus 1) infection    Infection, respiratory    2011   Left breast mass    stable in 04/2011   Vulvovaginitis     Patient Active Problem List   Diagnosis Date Noted   UTI (urinary tract infection) 01/09/2014   Frequency 01/09/2014   Abnormal uterine bleeding 06/30/2012   Vaginitis 05/02/2012    Past Surgical History:  Procedure Laterality Date   TYMPANOSTOMY TUBE PLACEMENT     bilateral     OB History     Gravida  0   Para  0   Term  0   Preterm  0   AB  0   Living  0      SAB  0   IAB  0   Ectopic  0   Multiple  0   Live Births              Family History  Problem Relation Age of Onset   Cancer Maternal Grandmother        breast   Breast cancer Maternal Grandmother 50   Cancer Maternal Grandfather    Breast cancer Maternal Grandfather 50   Cancer Maternal Aunt        breast   Breast cancer Maternal Aunt 40    Social History   Tobacco Use   Smoking status: Never   Smokeless tobacco: Never  Vaping Use   Vaping Use: Never used  Substance Use Topics   Alcohol use: No   Drug use: No    Home  Medications Prior to Admission medications   Medication Sig Start Date End Date Taking? Authorizing Provider  cephALEXin (KEFLEX) 500 MG capsule Take 1 capsule (500 mg total) by mouth 2 (two) times daily for 7 days. 05/12/21 05/19/21 Yes Dickson Kostelnik, DO  drospirenone-ethinyl estradiol (YAZ,GIANVI,LORYNA) 3-0.02 MG tablet Take 1 tablet by mouth daily.    [provider]  nitrofurantoin, macrocrystal-monohydrate, (MACROBID) 100 MG capsule Take 1 capsule (100 mg total) by mouth 2 (two) times daily. 12/28/13   Bennie Pierini, FNP    Allergies    Macrobid [nitrofurantoin monohyd macro] and Latex  Review of Systems   Review of Systems  Constitutional:  Negative for fever.  Skin:  Positive for wound. Negative for rash.  Neurological:  Negative for focal weakness, weakness and numbness.   Physical Exam Updated Vital Signs BP 92/67 (BP Location: Left Arm)   Pulse 79   Temp (!) 97.5 F (36.4 C) (Oral)   Resp 16   Ht 4\' 11"  (1.499 m)  Wt 45.4 kg   LMP 04/28/2021   SpO2 100%   BMI 20.20 kg/m   Physical Exam Constitutional:      General: She is not in acute distress.    Appearance: She is not ill-appearing.  Musculoskeletal:        General: No swelling. Normal range of motion.  Skin:    General: Skin is warm.     Comments: Patient has small avulsion of the skin to the dorsum of the right index finger at the very tip of the finger.  Superficial not involving any bone.  Does involve part of the the nail that has also been cut away.  Neurological:     Mental Status: She is alert.     Sensory: No sensory deficit.     Motor: No weakness.    ED Results / Procedures / Treatments   Labs (all labs ordered are listed, but only abnormal results are displayed) Labs Reviewed - No data to display  EKG None  Radiology No results found.  Procedures Procedures   Medications Ordered in ED Medications  Tdap (BOOSTRIX) injection 0.5 mL (has no administration in time  range)  cephALEXin (KEFLEX) capsule 500 mg (has no administration in time range)    ED Course  I have reviewed the triage vital signs and the nursing notes.  Pertinent labs & imaging results that were available during my care of the patient were reviewed by me and considered in my medical decision making (see chart for details).    MDM Rules/Calculators/A&P                           Ellwood Sayers is here with laceration to right index finger.  Hemostatic.  Tetanus shot updated.  Neurovascular neuromuscularly intact.  Patient using a slicer and she has avulsion injury to the back of the right index finger at the very tip of the finger.  Overall superficial the skin at the tip of the right index finger was sliced as well as some portion of the fingernail at the very tip.  Overall wound looks clean and dry and intact.  This was washed out and cleaned in the ED.  Bacitracin ointment placed.  Overall will have to heal by secondary intention as there is no tissue to repair.  Recommend soap and water twice a day as well as Neosporin and bacitracin twice a day.  Will prescribe antibiotics.  Understands to return if any signs of infection develop.  Discharged in good condition.  This chart was dictated using voice recognition software.  Despite best efforts to proofread,  errors can occur which can change the documentation meaning.  Final Clinical Impression(s) / ED Diagnoses Final diagnoses:  Laceration of right index finger, foreign body presence unspecified, nail damage status unspecified, initial encounter    Rx / DC Orders ED Discharge Orders          Ordered    cephALEXin (KEFLEX) 500 MG capsule  2 times daily        05/12/21 1026             Addilynn Mowrer, DO 05/12/21 1027

## 2021-05-12 NOTE — Discharge Instructions (Addendum)
Wash your wound site twice a day with soap and water gently.  Recommend bacitracin/Neosporin ointment twice a day.  Keep wound covered.  Take antibiotic as prescribed.  Return if any signs of infection develop.

## 2022-08-21 ENCOUNTER — Encounter: Payer: Self-pay | Admitting: Family Medicine

## 2022-08-21 ENCOUNTER — Ambulatory Visit: Payer: BC Managed Care – PPO | Admitting: Family Medicine

## 2022-08-21 VITALS — BP 80/54 | HR 83 | Temp 98.3°F | Ht 59.0 in | Wt 104.4 lb

## 2022-08-21 DIAGNOSIS — N83209 Unspecified ovarian cyst, unspecified side: Secondary | ICD-10-CM

## 2022-08-21 DIAGNOSIS — Z0000 Encounter for general adult medical examination without abnormal findings: Secondary | ICD-10-CM | POA: Diagnosis not present

## 2022-08-21 DIAGNOSIS — Z13220 Encounter for screening for lipoid disorders: Secondary | ICD-10-CM

## 2022-08-21 DIAGNOSIS — Z803 Family history of malignant neoplasm of breast: Secondary | ICD-10-CM | POA: Diagnosis not present

## 2022-08-21 LAB — CBC WITH DIFFERENTIAL/PLATELET
Basophils Absolute: 0 10*3/uL (ref 0.0–0.1)
Basophils Relative: 1 % (ref 0.0–3.0)
Eosinophils Absolute: 0.1 10*3/uL (ref 0.0–0.7)
Eosinophils Relative: 2.5 % (ref 0.0–5.0)
HCT: 35.1 % — ABNORMAL LOW (ref 36.0–46.0)
Hemoglobin: 12 g/dL (ref 12.0–15.0)
Lymphocytes Relative: 23.2 % (ref 12.0–46.0)
Lymphs Abs: 1 10*3/uL (ref 0.7–4.0)
MCHC: 34.1 g/dL (ref 30.0–36.0)
MCV: 92.4 fl (ref 78.0–100.0)
Monocytes Absolute: 0.3 10*3/uL (ref 0.1–1.0)
Monocytes Relative: 7.3 % (ref 3.0–12.0)
Neutro Abs: 2.8 10*3/uL (ref 1.4–7.7)
Neutrophils Relative %: 66 % (ref 43.0–77.0)
Platelets: 325 10*3/uL (ref 150.0–400.0)
RBC: 3.81 Mil/uL — ABNORMAL LOW (ref 3.87–5.11)
RDW: 12.8 % (ref 11.5–15.5)
WBC: 4.2 10*3/uL (ref 4.0–10.5)

## 2022-08-21 LAB — COMPREHENSIVE METABOLIC PANEL
ALT: 10 U/L (ref 0–35)
AST: 15 U/L (ref 0–37)
Albumin: 4.6 g/dL (ref 3.5–5.2)
Alkaline Phosphatase: 31 U/L — ABNORMAL LOW (ref 39–117)
BUN: 12 mg/dL (ref 6–23)
CO2: 28 mEq/L (ref 19–32)
Calcium: 9.1 mg/dL (ref 8.4–10.5)
Chloride: 103 mEq/L (ref 96–112)
Creatinine, Ser: 0.73 mg/dL (ref 0.40–1.20)
GFR: 106.68 mL/min (ref >60.00)
Glucose, Bld: 76 mg/dL (ref 70–99)
Potassium: 3.7 mEq/L (ref 3.5–5.1)
Sodium: 137 mEq/L (ref 135–145)
Total Bilirubin: 0.7 mg/dL (ref 0.2–1.2)
Total Protein: 7.4 g/dL (ref 6.0–8.3)

## 2022-08-21 LAB — T4, FREE: Free T4: 0.88 ng/dL (ref 0.60–1.60)

## 2022-08-21 LAB — LIPID PANEL
Cholesterol: 147 mg/dL (ref 0–200)
HDL: 69.7 mg/dL (ref >39.00)
LDL Cholesterol: 72 mg/dL (ref 0–99)
NonHDL: 77.74
Total CHOL/HDL Ratio: 2
Triglycerides: 28 mg/dL (ref 0.0–149.0)
VLDL: 5.6 mg/dL (ref 0.0–40.0)

## 2022-08-21 LAB — TSH: TSH: 1.26 u[IU]/mL (ref 0.35–5.50)

## 2022-08-21 LAB — HEMOGLOBIN A1C: Hgb A1c MFr Bld: 4.2 % — ABNORMAL LOW (ref 4.6–6.5)

## 2022-08-21 NOTE — Progress Notes (Signed)
Subjective:     Abigail Benton is a 36 y.o. female and is here to establish care and for a comprehensive physical exam. The patient reports doing well overall.  Has not had a PCP in a while.  Followed by OB/GYN, Dr. Jaymes Graff.  Endorses history of 7 cm ovarian cyst planning to have removed.  Patient endorses painful menstrual cramps for which she takes ibuprofen 800 mg as needed.  Patient interested in having labs to check thyroid and liver function.  Patient endorses family history of breast cancer on mom's side including maternal aunt, MGM, MGF.  Patient's mom had genetic testing which was negative for increased likelihood of developing breast cancer.  Social history: Patient is single.  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  Social History   Socioeconomic History   Marital status: Single    Spouse name: Not on file   Number of children: Not on file   Years of education: Not on file   Highest education level: Not on file  Occupational History   Not on file  Tobacco Use   Smoking status: Never   Smokeless tobacco: Never  Vaping Use   Vaping Use: Never used  Substance and Sexual Activity   Alcohol use: No   Drug use: No   Sexual activity: Yes    Birth control/protection: I.U.D.    Comment: Abigail Benton  Other Topics Concern   Not on file  Social History Narrative   Not on file   Social Determinants of Health   Financial Resource Strain: Not on file  Food Insecurity: Not on file  Transportation Needs: Not on file  Physical Activity: Not on file  Stress: Not on file  Social Connections: Not on file  Intimate Partner Violence: Not on file   Health Maintenance  Topic Date Due   PAP SMEAR-Modifier  05/03/2015   COVID-19 Vaccine (3 - Moderna series) 02/27/2020   INFLUENZA VACCINE  12/24/2022 (Originally 05/05/2022)   Hepatitis C Screening   Completed   HIV Screening  Completed   HPV VACCINES  Aged Out    The following portions of the patient's history were reviewed and updated as appropriate: allergies, current medications, past family history, past medical history, past social history, past surgical history, and problem list.  Review of Systems Pertinent items noted in HPI and remainder of comprehensive ROS otherwise negative.   Objective:    BP (!) 80/54 (BP Location: Right Arm, Patient Position: Sitting, Cuff Size: Normal)   Pulse 83   Temp 98.3 F (36.8 C) (Oral)   Ht 4\' 11"  (1.499 m)   Wt 104 lb 6.4 oz (47.4 kg)   LMP 07/30/2022   SpO2 99%   BMI 21.09 kg/m  General appearance: alert, cooperative, and no distress Head: Normocephalic, without obvious abnormality, atraumatic Eyes: conjunctivae/corneas clear. PERRL, EOM's intact. Fundi benign. Ears: normal TM's and external ear canals both ears Nose: Nares normal. Septum midline. Mucosa normal. No drainage or sinus tenderness. Throat: lips, mucosa, and tongue normal; teeth and gums normal Neck: no adenopathy, no carotid bruit, no JVD, supple, symmetrical, trachea midline, and thyroid not enlarged, symmetric, no tenderness/mass/nodules Lungs: clear to auscultation bilaterally Heart: regular rate and rhythm, S1, S2 normal, no murmur, click, rub or gallop Abdomen: soft, non-tender; bowel sounds normal; no masses,  no organomegaly Extremities: extremities normal, atraumatic, no cyanosis or edema Pulses: 2+ and  symmetric Skin: Skin color, texture, turgor normal. No rashes or lesions Lymph nodes: Cervical, supraclavicular, and axillary nodes normal. Neurologic: Alert and oriented X 3, normal strength and tone. Normal symmetric reflexes. Normal coordination and gait    Assessment:    Healthy female exam.      Plan:    Anticipatory guidance given including wearing seatbelts, smoke detectors in the home, increasing physical activity, increasing p.o. intake of water  and vegetables. -labs -Immunizations reviewed.  Patient declines influenza vaccine this time. -Pap done with OB/GYN 12/12/19 -Given handout -Next CPE in 1 year See After Visit Summary for Counseling Recommendations  Well adult exam - Plan: TSH, T4, Free, CMP  Cyst of ovary, unspecified laterality -Continue follow-up with OB/GYN, Dr. Normand Sloop  - Plan: CBC with Differential/Platelet, TSH, T4, Free, Hemoglobin A1c, CMP  Family history of breast cancer  Screening for cholesterol level  - Plan: Lipid panel  F/u prn   Abbe Amsterdam, MD

## 2022-11-26 ENCOUNTER — Encounter: Payer: Self-pay | Admitting: Family Medicine

## 2022-11-26 ENCOUNTER — Ambulatory Visit: Payer: BC Managed Care – PPO | Admitting: Family Medicine

## 2022-11-26 VITALS — BP 100/60 | HR 99 | Temp 98.8°F | Ht 59.0 in | Wt 105.6 lb

## 2022-11-26 DIAGNOSIS — Z113 Encounter for screening for infections with a predominantly sexual mode of transmission: Secondary | ICD-10-CM

## 2022-11-26 DIAGNOSIS — K120 Recurrent oral aphthae: Secondary | ICD-10-CM | POA: Diagnosis not present

## 2022-11-26 MED ORDER — MAGIC MOUTHWASH W/LIDOCAINE: 5.0000 mL | Freq: Four times a day (QID) | ORAL | 0 refills | Status: AC

## 2022-11-26 NOTE — Progress Notes (Signed)
   Established Patient Office Visit   Subjective  Patient ID: Abigail Benton, female    DOB: November 02, 1985  Age: 37 y.o. MRN: [id number]  Chief Complaint  Patient presents with   Mouth Lesions    X2 weeks    Is a 37 year old female 1 concern.  Patient endorses developing a fever the week prior to Valentine's Day.  Patient assumed she was becoming sick with a viral illness as most of her coworkers were out with cold-like symptoms.  By that weekend patient developed sores in mouth, two on left lower gum and one on left lateral tongue.  Symptoms improved but returned a week later and ulcer underneath right side of tongue and one seeming to appear in back of throat.  Patient tried gargling with warm salt water, hydrogen peroxide rinse.  Patient interested in HIV and syphilis testing.  Endorses recent intercourse with a repeat partner.  Has appointment with OB/GYN in a few months for remaining testing.  Denies current symptoms.  Mouth Lesions  Associated symptoms include mouth sores.      Review of Systems  HENT:  Positive for mouth sores.    Negative unless stated above    Objective:     BP 100/60 (BP Location: Left Arm, Patient Position: Sitting, Cuff Size: Normal)   Pulse 99   Temp 98.8 F (37.1 C) (Oral)   Ht 4' 11"$  (1.499 m)   Wt 105 lb 9.6 oz (47.9 kg)   SpO2 99%   BMI 21.33 kg/m    Physical Exam Constitutional:      General: She is not in acute distress.    Appearance: Normal appearance.  HENT:     Head: Normocephalic and atraumatic.     Nose: Nose normal.     Mouth/Throat:     Mouth: Mucous membranes are dry.     Pharynx: Posterior oropharyngeal erythema present.      Comments: Papule on sublingual surface of base of mouth near right side of frenulum of tongue.  Erythematous abrasion versus patch on upper left soft palate proximal pharynx. Eyes:     Extraocular Movements: Extraocular movements intact.     Conjunctiva/sclera: Conjunctivae normal.      Pupils: Pupils are equal, round, and reactive to light.  Cardiovascular:     Rate and Rhythm: Normal rate and regular rhythm.     Heart sounds: No murmur heard.    No gallop.  Pulmonary:     Effort: Pulmonary effort is normal. No respiratory distress.     Breath sounds: No wheezing, rhonchi or rales.  Skin:    General: Skin is warm and dry.  Neurological:     Mental Status: She is alert and oriented to person, place, and time.      No results found for any visits on 11/26/22.    Assessment & Plan:  Aphthous ulcer of mouth -     magic mouthwash w/lidocaine; Take 5 mLs by mouth 4 (four) times daily for 7 days.  Dispense: 150 mL; Refill: 0  Routine screening for STI (sexually transmitted infection) -     HIV Antibody (routine testing w rflx) -     RPR  Aphthous ulcer likely 2/2 stress or change in diet.  Advised viral illnesses can also cause similar symptoms.  Start Magic mouthwash.  Self-care encouraged.  Obtain labs for STI screening.  Return if symptoms worsen or fail to improve.   Billie Ruddy, MD

## 2022-11-27 LAB — HIV ANTIBODY (ROUTINE TESTING W REFLEX): HIV 1&2 Ab, 4th Generation: NONREACTIVE

## 2022-11-27 LAB — RPR: RPR Ser Ql: NONREACTIVE

## 2022-12-21 ENCOUNTER — Encounter (HOSPITAL_BASED_OUTPATIENT_CLINIC_OR_DEPARTMENT_OTHER): Payer: Self-pay

## 2022-12-21 ENCOUNTER — Ambulatory Visit (HOSPITAL_BASED_OUTPATIENT_CLINIC_OR_DEPARTMENT_OTHER): Admit: 2022-12-21 | Payer: BC Managed Care – PPO | Admitting: Obstetrics and Gynecology

## 2022-12-21 SURGERY — EXCISION, CYST, OVARY, LAPAROSCOPIC
Anesthesia: General

## 2023-03-08 ENCOUNTER — Encounter (HOSPITAL_BASED_OUTPATIENT_CLINIC_OR_DEPARTMENT_OTHER): Payer: Self-pay | Admitting: Obstetrics and Gynecology

## 2023-03-08 NOTE — Progress Notes (Signed)
Spoke w/ via phone for pre-op interview--- Bri Lab needs dos----   UPT per anesthesia, Surgeon orders pending.            Lab results------ COVID test -----patient states asymptomatic no test needed Arrive at -------0530 NPO after MN NO Solid Food.   Med rec completed Medications to take morning of surgery -----NONE Diabetic medication ----- Patient instructed no nail polish to be worn day of surgery Patient instructed to bring photo id and insurance card day of surgery Patient aware to have Driver (ride ) / caregiver  Mother Carilyn Goodpasture  for 24 hours after surgery  Patient Special Instructions ----- Pre-Op special Instructions ----- Patient verbalized understanding of instructions that were given at this phone interview. Patient denies shortness of breath, chest pain, fever, cough at this phone interview.

## 2023-03-24 NOTE — H&P (Signed)
Shelbey Conquest is an 37 y.o. female presenting for scheduled procedure  Pertinent Gynecological History: Menses: flow is moderate and regular every month without intermenstrual spotting Bleeding: WNL Contraception: condoms DES exposure: denies Blood transfusions: none Sexually transmitted diseases: no past history Previous GYN Procedures:  none   Last pap: normal Date: 12/03/2021 OB History: G0, P0   Menstrual History: Menarche age: ealry teens Patient's last menstrual period was 02/23/2023 (approximate).    Past Medical History:  Diagnosis Date   Anemia    H/O hematuria    H/O sickle cell trait    HSV-1 (herpes simplex virus 1) infection    Infection, respiratory    2011   Left breast mass    stable in 04/2011   Vulvovaginitis     Past Surgical History:  Procedure Laterality Date   TYMPANOSTOMY TUBE PLACEMENT     bilateral   wisdom teeth      Family History  Problem Relation Age of Onset   Cancer Maternal Aunt        breast   Breast cancer Maternal Aunt 40   Cancer Maternal Grandmother        breast   Breast cancer Maternal Grandmother 50   Cancer Maternal Grandfather    Breast cancer Maternal Grandfather 73   Alcohol abuse Paternal Grandfather    Alcohol abuse Other     Social History:  reports that she has never smoked. She has never used smokeless tobacco. She reports that she does not drink alcohol and does not use drugs.  Allergies:  Allergies  Allergen Reactions   Macrobid [Nitrofurantoin Monohyd Macro]    Latex Rash    No medications prior to admission.    Review of Systems  Constitutional:  Negative for chills and fever.  Respiratory:  Negative for shortness of breath.   Cardiovascular:  Negative for chest pain, palpitations and leg swelling.  Gastrointestinal:  Negative for abdominal pain, nausea and vomiting.  Neurological:  Negative for dizziness, weakness and headaches.  Psychiatric/Behavioral:  Negative for suicidal ideas.      Height 4\' 11"  (1.499 m), weight 48.1 kg, last menstrual period 02/23/2023. Physical Exam Gen: NAD CV; CTAB, RRR GI: mild TTP DEEP in RLQ GU: deferred Psych/neuro: WNL No results found for this or any previous visit (from the past 24 hour(s)). TVUS 4/18: 8.48x6.16x3.69cm, EMS 1.3cm, left ovarian cyst with ground glass appearance. 5.58x4.68x6.54cm Multiple IM fibroids 1-2.5cm, no apparent submucosal involvement. LUS appears irregular in shape  No results found.  Assessment/Plan: 36yo G0 on condoms presents for f/u of TVUS for evaluation of known left ovarian cyst. Stable in size approx 7cm, no obvious malignant features. Desires surgical management, symptomatic.  Risks of Laparoscopic left ovarian cystectomy, possible salpingectomy-oophorectomy include infection of the uterus, pelvic organs, or skin, inadvertent injury to internal organs, such as bowel or bladder. If there is major injury, extensive surgery may be required. If injury is minor, it may be treated with relative ease. Discussed possibility of excessive blood loss and transfusion. Patient accepts the possibility of blood transfusion, if necessary. Bowel and/or bladder injury may require prolonged inpatient stay and possible colostomy, Foley catheter, etc, as deemed fit by other surgeon. Patient understands and agrees to move forward with surgery.  Valerie Roys Nicholaus Steinke 03/24/2023, 11:41 PM

## 2023-03-25 ENCOUNTER — Other Ambulatory Visit: Payer: Self-pay

## 2023-03-25 ENCOUNTER — Encounter (HOSPITAL_BASED_OUTPATIENT_CLINIC_OR_DEPARTMENT_OTHER)
Admission: RE | Disposition: A | Discharge status: Home / Self Care | Payer: Self-pay | Source: Home / Self Care | Attending: Obstetrics and Gynecology

## 2023-03-25 ENCOUNTER — Ambulatory Visit (HOSPITAL_BASED_OUTPATIENT_CLINIC_OR_DEPARTMENT_OTHER)
Admission: RE | Admit: 2023-03-25 | Discharge: 2023-03-25 | Disposition: A | Discharge status: Home / Self Care | Payer: BC Managed Care – PPO | Attending: Obstetrics and Gynecology | Admitting: Obstetrics and Gynecology

## 2023-03-25 ENCOUNTER — Encounter (HOSPITAL_BASED_OUTPATIENT_CLINIC_OR_DEPARTMENT_OTHER): Payer: Self-pay | Admitting: Obstetrics and Gynecology

## 2023-03-25 ENCOUNTER — Ambulatory Visit (HOSPITAL_BASED_OUTPATIENT_CLINIC_OR_DEPARTMENT_OTHER): Payer: BC Managed Care – PPO | Admitting: Anesthesiology

## 2023-03-25 DIAGNOSIS — N80319 Endometriosis of the anterior cul-de-sac, unspecified depth: Secondary | ICD-10-CM | POA: Diagnosis not present

## 2023-03-25 DIAGNOSIS — N83202 Unspecified ovarian cyst, left side: Secondary | ICD-10-CM | POA: Insufficient documentation

## 2023-03-25 DIAGNOSIS — N80329 Endometriosis of the posterior cul-de-sac, unspecified depth: Secondary | ICD-10-CM | POA: Diagnosis not present

## 2023-03-25 DIAGNOSIS — Z01818 Encounter for other preprocedural examination: Secondary | ICD-10-CM

## 2023-03-25 HISTORY — PX: LAPAROSCOPIC OVARIAN CYSTECTOMY: SHX6248

## 2023-03-25 LAB — POCT PREGNANCY, URINE: Preg Test, Ur: NEGATIVE

## 2023-03-25 LAB — TYPE AND SCREEN
ABO/RH(D): A NEG
Antibody Screen: NEGATIVE

## 2023-03-25 LAB — ABO/RH: ABO/RH(D): A NEG

## 2023-03-25 SURGERY — EXCISION, CYST, OVARY, LAPAROSCOPIC
Anesthesia: General | Site: Abdomen | Laterality: Left

## 2023-03-25 MED ORDER — ONDANSETRON HCL 4 MG/2ML IJ SOLN
INTRAMUSCULAR | Status: AC
  Filled 2023-03-25: qty 2

## 2023-03-25 MED ORDER — OXYCODONE HCL 5 MG/5ML PO SOLN: 5.0000 mg | Freq: Once | ORAL | Status: DC | PRN

## 2023-03-25 MED ORDER — MEPERIDINE HCL 25 MG/ML IJ SOLN: 6.2500 mg | INTRAMUSCULAR | Status: DC | PRN

## 2023-03-25 MED ORDER — FENTANYL CITRATE (PF) 100 MCG/2ML IJ SOLN
INTRAMUSCULAR | Status: AC
  Filled 2023-03-25: qty 2

## 2023-03-25 MED ORDER — ACETAMINOPHEN 500 MG PO TABS
ORAL_TABLET | ORAL | Status: AC
  Filled 2023-03-25: qty 2

## 2023-03-25 MED ORDER — PROPOFOL 10 MG/ML IV BOLUS
INTRAVENOUS | Status: DC | PRN
  Administered 2023-03-25: 120 mg via INTRAVENOUS

## 2023-03-25 MED ORDER — 0.9 % SODIUM CHLORIDE (POUR BTL) OPTIME
TOPICAL | Status: DC | PRN
  Administered 2023-03-25: 500 mL

## 2023-03-25 MED ORDER — PROPOFOL 10 MG/ML IV BOLUS
INTRAVENOUS | Status: AC
  Filled 2023-03-25: qty 20

## 2023-03-25 MED ORDER — LIDOCAINE HCL (CARDIAC) PF 100 MG/5ML IV SOSY
PREFILLED_SYRINGE | INTRAVENOUS | Status: DC | PRN
  Administered 2023-03-25: 40 mg via INTRAVENOUS

## 2023-03-25 MED ORDER — ONDANSETRON HCL 4 MG/2ML IJ SOLN
INTRAMUSCULAR | Status: DC | PRN
  Administered 2023-03-25: 4 mg via INTRAVENOUS

## 2023-03-25 MED ORDER — KETOROLAC TROMETHAMINE 30 MG/ML IJ SOLN
INTRAMUSCULAR | Status: DC | PRN
  Administered 2023-03-25: 30 mg via INTRAVENOUS

## 2023-03-25 MED ORDER — ACETAMINOPHEN 500 MG PO TABS
1000.0000 mg | ORAL_TABLET | ORAL | Status: AC
  Administered 2023-03-25: 1000 mg via ORAL

## 2023-03-25 MED ORDER — SOD CITRATE-CITRIC ACID 500-334 MG/5ML PO SOLN: 30.0000 mL | ORAL | Status: DC

## 2023-03-25 MED ORDER — MIDAZOLAM HCL 5 MG/5ML IJ SOLN
INTRAMUSCULAR | Status: DC | PRN
  Administered 2023-03-25 (×2): 1 mg via INTRAVENOUS

## 2023-03-25 MED ORDER — POVIDONE-IODINE 10 % EX SWAB: 2.0000 | Freq: Once | CUTANEOUS | Status: DC

## 2023-03-25 MED ORDER — SUGAMMADEX SODIUM 200 MG/2ML IV SOLN
INTRAVENOUS | Status: DC | PRN
  Administered 2023-03-25: 200 mg via INTRAVENOUS

## 2023-03-25 MED ORDER — ROCURONIUM BROMIDE 10 MG/ML (PF) SYRINGE
PREFILLED_SYRINGE | INTRAVENOUS | Status: AC
  Filled 2023-03-25: qty 10

## 2023-03-25 MED ORDER — IBUPROFEN 800 MG PO TABS
800.0000 mg | ORAL_TABLET | Freq: Three times a day (TID) | ORAL | 0 refills | Status: AC | PRN
Start: 2023-03-25 — End: ?

## 2023-03-25 MED ORDER — DEXAMETHASONE SODIUM PHOSPHATE 10 MG/ML IJ SOLN
INTRAMUSCULAR | Status: AC
  Filled 2023-03-25: qty 1

## 2023-03-25 MED ORDER — OXYCODONE HCL 5 MG PO TABS: 5.0000 mg | ORAL_TABLET | Freq: Once | ORAL | Status: DC | PRN

## 2023-03-25 MED ORDER — LACTATED RINGERS IV SOLN: INTRAVENOUS | Status: DC

## 2023-03-25 MED ORDER — MIDAZOLAM HCL 2 MG/2ML IJ SOLN
INTRAMUSCULAR | Status: AC
  Filled 2023-03-25: qty 2

## 2023-03-25 MED ORDER — BUPIVACAINE HCL (PF) 0.25 % IJ SOLN
INTRAMUSCULAR | Status: DC | PRN
  Administered 2023-03-25: 10 mL

## 2023-03-25 MED ORDER — LIDOCAINE HCL (PF) 2 % IJ SOLN
INTRAMUSCULAR | Status: AC
  Filled 2023-03-25: qty 5

## 2023-03-25 MED ORDER — ROCURONIUM BROMIDE 100 MG/10ML IV SOLN
INTRAVENOUS | Status: DC | PRN
  Administered 2023-03-25: 50 mg via INTRAVENOUS

## 2023-03-25 MED ORDER — PROMETHAZINE HCL 25 MG/ML IJ SOLN: 6.2500 mg | INTRAMUSCULAR | Status: DC | PRN

## 2023-03-25 MED ORDER — OXYCODONE HCL 5 MG PO TABS: 5.0000 mg | ORAL_TABLET | Freq: Four times a day (QID) | ORAL | 0 refills | Status: AC | PRN

## 2023-03-25 MED ORDER — FENTANYL CITRATE (PF) 100 MCG/2ML IJ SOLN
INTRAMUSCULAR | Status: DC | PRN
  Administered 2023-03-25: 100 ug via INTRAVENOUS
  Administered 2023-03-25 (×2): 50 ug via INTRAVENOUS

## 2023-03-25 MED ORDER — SODIUM CHLORIDE 0.9 % IR SOLN
Status: DC | PRN
  Administered 2023-03-25: 1000 mL

## 2023-03-25 MED ORDER — HYDROMORPHONE HCL 1 MG/ML IJ SOLN: 0.2500 mg | INTRAMUSCULAR | Status: DC | PRN

## 2023-03-25 MED ORDER — AMISULPRIDE (ANTIEMETIC) 5 MG/2ML IV SOLN: 10.0000 mg | Freq: Once | INTRAVENOUS | Status: DC | PRN

## 2023-03-25 MED ORDER — DEXAMETHASONE SODIUM PHOSPHATE 4 MG/ML IJ SOLN
INTRAMUSCULAR | Status: DC | PRN
  Administered 2023-03-25: 5 mg via INTRAVENOUS

## 2023-03-25 SURGICAL SUPPLY — 40 items
ADH SKN CLS APL DERMABOND .7 (GAUZE/BANDAGES/DRESSINGS) ×2
BLADE SURG 10 STRL SS (BLADE) IMPLANT
DERMABOND ADVANCED .7 DNX12 (GAUZE/BANDAGES/DRESSINGS) ×2 IMPLANT
DRAPE SURG IRRIG POUCH 19X23 (DRAPES) ×2 IMPLANT
DURAPREP 26ML APPLICATOR (WOUND CARE) ×2 IMPLANT
GAUZE 4X4 16PLY ~~LOC~~+RFID DBL (SPONGE) ×2 IMPLANT
GLOVE BIOGEL PI IND STRL 6 (GLOVE) IMPLANT
GLOVE BIOGEL PI IND STRL 6.5 (GLOVE) ×4 IMPLANT
GLOVE BIOGEL PI IND STRL 7.5 (GLOVE) IMPLANT
GLOVE SURG SS PI 6.0 STRL IVOR (GLOVE) IMPLANT
GLOVE SURG SS PI 6.5 STRL IVOR (GLOVE) IMPLANT
GOWN STRL REUS W/ TWL LRG LVL3 (GOWN DISPOSABLE) IMPLANT
GOWN STRL REUS W/TWL LRG LVL3 (GOWN DISPOSABLE) ×8 IMPLANT
IRRIG SUCT STRYKERFLOW 2 WTIP (MISCELLANEOUS) ×2
IRRIGATION SUCT STRKRFLW 2 WTP (MISCELLANEOUS) ×2 IMPLANT
KIT PINK PAD W/HEAD ARE REST (MISCELLANEOUS) ×2
KIT PINK PAD W/HEAD ARM REST (MISCELLANEOUS) ×2 IMPLANT
KIT TURNOVER CYSTO (KITS) ×2 IMPLANT
NDL INSUFFLATION 14GA 120MM (NEEDLE) ×2 IMPLANT
NEEDLE INSUFFLATION 14GA 120MM (NEEDLE) ×2 IMPLANT
NS IRRIG 500ML POUR BTL (IV SOLUTION) IMPLANT
PACK LAPAROSCOPY BASIN (CUSTOM PROCEDURE TRAY) ×2 IMPLANT
POUCH LAPAROSCOPIC INSTRUMENT (MISCELLANEOUS) ×2 IMPLANT
PROTECTOR NERVE ULNAR (MISCELLANEOUS) ×4 IMPLANT
SET TRI-LUMEN FLTR TB AIRSEAL (TUBING) IMPLANT
SET TUBE SMOKE EVAC HIGH FLOW (TUBING) ×2 IMPLANT
SHEARS HARMONIC ACE PLUS 36CM (ENDOMECHANICALS) IMPLANT
SLEEVE SCD COMPRESS KNEE MED (STOCKING) ×2 IMPLANT
SUT VIC AB 3-0 PS2 18 (SUTURE) ×2
SUT VIC AB 3-0 PS2 18XBRD (SUTURE) ×2 IMPLANT
SUT VICRYL 0 UR6 27IN ABS (SUTURE) ×2 IMPLANT
SYS RETRIEVAL 5MM INZII UNIV (BASKET) ×2
SYSTEM CARTER THOMASON II (TROCAR) IMPLANT
SYSTEM RETRIEVL 5MM INZII UNIV (BASKET) IMPLANT
TOWEL OR 17X24 6PK STRL BLUE (TOWEL DISPOSABLE) ×2 IMPLANT
TRAY FOLEY W/BAG SLVR 14FR LF (SET/KITS/TRAYS/PACK) ×2 IMPLANT
TROCAR PORT AIRSEAL 5X120 (TROCAR) IMPLANT
TROCAR Z-THREAD FIOS 11X100 BL (TROCAR) IMPLANT
TROCAR Z-THREAD FIOS 5X100MM (TROCAR) ×4 IMPLANT
WARMER LAPAROSCOPE (MISCELLANEOUS) ×2 IMPLANT

## 2023-03-25 NOTE — Anesthesia Preprocedure Evaluation (Signed)
Anesthesia Evaluation  Patient identified by MRN, date of birth, ID band Patient awake    Reviewed: Allergy & Precautions, H&P , NPO status , Patient's Chart, lab work & pertinent test results  Airway Mallampati: II  TM Distance: >3 FB Neck ROM: Full    Dental no notable dental hx.    Pulmonary neg pulmonary ROS   Pulmonary exam normal breath sounds clear to auscultation       Cardiovascular negative cardio ROS Normal cardiovascular exam Rhythm:Regular Rate:Normal     Neuro/Psych negative neurological ROS  negative psych ROS   GI/Hepatic negative GI ROS, Neg liver ROS,,,  Endo/Other  negative endocrine ROS    Renal/GU negative Renal ROS  negative genitourinary   Musculoskeletal negative musculoskeletal ROS (+)    Abdominal   Peds negative pediatric ROS (+)  Hematology  (+) Blood dyscrasia, anemia   Anesthesia Other Findings Ovarian cyst  Reproductive/Obstetrics negative OB ROS                             Anesthesia Physical Anesthesia Plan  ASA: 2  Anesthesia Plan: General   Post-op Pain Management: Tylenol PO (pre-op)*   Induction: Intravenous  PONV Risk Score and Plan: 3 and Ondansetron, Dexamethasone, Midazolam and Treatment may vary due to age or medical condition  Airway Management Planned: Oral ETT  Additional Equipment:   Intra-op Plan:   Post-operative Plan: Extubation in OR  Informed Consent: I have reviewed the patients History and Physical, chart, labs and discussed the procedure including the risks, benefits and alternatives for the proposed anesthesia with the patient or authorized representative who has indicated his/her understanding and acceptance.     Dental advisory given  Plan Discussed with: CRNA  Anesthesia Plan Comments:        Anesthesia Quick Evaluation

## 2023-03-25 NOTE — Transfer of Care (Signed)
Immediate Anesthesia Transfer of Care Note  Patient: Abigail Benton  Procedure(s) Performed: Procedure(s) (LRB): LAPAROSCOPIC UNILATERAL OVARIAN CYSTECTOMY (Left) LAPAROSCOPIC ABLATION OF ENDOMETRIOSIS  Patient Location: PACU  Anesthesia Type: General  Level of Consciousness: awake, sedated, patient cooperative and responds to stimulation  Airway & Oxygen Therapy: Patient Spontanous Breathing and Patient connected to [place] oxygen  Post-op Assessment: Report given to PACU RN, Post -op Vital signs reviewed and stable and Patient moving all extremities  Post vital signs: Reviewed and stable  Complications: No apparent anesthesia complications

## 2023-03-25 NOTE — Interval H&P Note (Signed)
History and Physical Interval Note:  03/25/2023 7:29 AM  Abigail Benton  has presented today for surgery, with the diagnosis of 7cm ovarian cyst.  The various methods of treatment have been discussed with the patient and family. After consideration of risks, benefits and other options for treatment, the patient has consented to  Procedure(s): LAPAROSCOPIC OVARIAN CYSTECTOMY (Left) as a surgical intervention.  The patient's history has been reviewed, patient examined, no change in status, stable for surgery.  I have reviewed the patient's chart and labs.  Questions were answered to the patient's satisfaction.     Amerigo Mcglory M Rena Sweeden

## 2023-03-25 NOTE — Op Note (Signed)
03/25/23  Surgeon: Ellison Hughs, MD Assist: Jenell Milliner, RNFA Procedure: 1) Left ovarian cystectomy 2) Ablation of endometriosis Indication: symptomatic left ovarian cyst Anesthesia: GETA by Dr Hyacinth Meeker EBL: <50cc IVF: 600cc UOP: 100cc Findings: External genitalia within normal limits.  On bimanual exam, smooth mobile mass palpated in lower left quadrant approximately 5 to 6 cm.  Retroverted uterus with irregular contour consistent with known uterine fibroids.  Right adnexa within normal limits.  Upon insertion of laparoscope, left fallopian tube appears within normal limits.  Left ovary appears as large smooth-walled pale cyst with no evidence of delineation between this and normal ovarian tissue.  Right fallopian tube and ovary appear within normal limits.  Retroverted uterus deflected anteriorly; 4 small fibroids noted across fundus measuring between 1 to 3 cm.  During dissection of left ovarian cyst from approximate half centimeter adhesion to left anterior fundus, cyst was ruptured and contents spilled indicating this was a chocolate cyst/endometrioma.  Endometriosis implants noted as following: Blue implant midline and posterior cul-de-sac between uterosacral ligaments, powder burn markings (dark brown) in left anterior cul-de-sac, brown shiny markings immediately superior to prior implants as noted.  Operative technique: The patient was taken to the operating room where a time out was performed to confirm patient and correct procedure. The patient was given no antibiotics in accordance with ACOG guidelines. General anesthesia was established. The patient was then positioned on the operating table in the dorsal lithotomy position with the legs supported using stirrups. All pressure points were padded and a Bair hugger was placed to maintain control of core body temperature.    The patient was then prepped and draped in the usual sterile fashion. A Foley catheter was inserted in normal sterile  fashion. An operative speculum was placed into the vagina, normal appearing cervix noed, somewhat anterior deflected. Speculum withdrawn and sponge stick placed in vagina.   Attention was then turned the abdomen with 0.25% marcaine plain was injected subdermally infraumbilically. A 5mm vertical incision was made infraumbilically. A 5mm trochar and sleeve were inserted through the incision using direct visualization with laparoscope. Once intraabdominal entry was confirmed. Pneumoperitoneum was established using Airseal. Subsequently, two other small incision were made in left and right lower quadrants, both 2cm above and 2cm medial to corresponding ASIS's. LLQ site 10mm, RLQ site 5mm. Airseal port introduced in RLQ, 10mm trochar introduced in LLQ.   Laparoscopic findings noted as above.  The harmonic scalpel was introduced through the left lower quadrant port and the mesosalpinx was transected in order to free left ovarian cyst.  IP ligament was cut and coagulated which allowed for mobilization of most of left ovarian cyst.  Adhesion noted to left anterior fundus as an findings.  While attempting to gently dissect cyst from uterus, contents ruptured indicating presence of endometrioma.  Suction irrigation used to deflate cyst as well as suction irrigate pelvis and abdomen.  Reverse Trendelenburg and Trendelenburg were used to ensure that all irrigation was collected.  A millimeter Endo Catch bag was then introduced to the left lower quadrant port after removing harmonic scalpel; left ovarian cyst placed into bag which was withdrawn through the left lower quadrant port site without issue.  A final survey of pelvis was performed and note was taken of uterine fibroids.  During the survey, excellent hemostasis noted.  Endometriosis implants viewed as an findings and ablated with harmonic scalpel.  Once again, excellent hemostasis was noted.  The harmonic scalpel was withdrawn and the [employer] fascial closure  device  was inserted.  0 Vicryl used to close incision and routine fashion.  After this, AirSeal port was removed under direct visualization.  Pneumoperitoneum evacuated and final umbilical port withdrawn. Skin was closed using 4-0 Monocryl and Dermabond overlying. Sponge stick removed from vagina.    The patient was transferred to recovery room in stable condition. All needle, sponge and instrument counts were noted to be correct. x2 at end of procedure.

## 2023-03-25 NOTE — Anesthesia Postprocedure Evaluation (Signed)
Anesthesia Post Note  Patient: Abigail Benton  Procedure(s) Performed: LAPAROSCOPIC UNILATERAL OVARIAN CYSTECTOMY (Left: Abdomen) LAPAROSCOPIC ABLATION OF ENDOMETRIOSIS (Abdomen)     Patient location during evaluation: PACU Anesthesia Type: General Level of consciousness: awake and alert Pain management: pain level controlled Vital Signs Assessment: post-procedure vital signs reviewed and stable Respiratory status: spontaneous breathing, nonlabored ventilation and respiratory function stable Cardiovascular status: blood pressure returned to baseline and stable Postop Assessment: no apparent nausea or vomiting Anesthetic complications: no   No notable events documented.  Last Vitals:  Vitals:   03/25/23 0915 03/25/23 0953  BP: 102/62 100/71  Pulse: 72 64  Resp: 14 14  Temp:  (!) 36.4 C  SpO2: 97% 100%    Last Pain:  Vitals:   03/25/23 0953  TempSrc:   PainSc: 0-No pain                 Lowella Curb

## 2023-03-25 NOTE — Discharge Instructions (Addendum)
     No acetaminophen/Tylenol until after 12:00 pm today if needed.  No ibuprofen, Advil, Aleve, Motrin, ketorolac, meloxicam, naproxen, or other NSAIDS until after 4:30 pm today if needed.   Post Anesthesia Home Care Instructions  Activity: Get plenty of rest for the remainder of the day. A responsible individual must stay with you for 24 hours following the procedure.  For the next 24 hours, DO NOT: -Drive a car -Advertising copywriter -Drink alcoholic beverages -Take any medication unless instructed by your physician -Make any legal decisions or sign important papers.  Meals: Start with liquid foods such as gelatin or soup. Progress to regular foods as tolerated. Avoid greasy, spicy, heavy foods. If nausea and/or vomiting occur, drink only clear liquids until the nausea and/or vomiting subsides. Call your physician if vomiting continues.  Special Instructions/Symptoms: Your throat may feel dry or sore from the anesthesia or the breathing tube placed in your throat during surgery. If this causes discomfort, gargle with warm salt water. The discomfort should disappear within 24 hours.

## 2023-03-25 NOTE — Anesthesia Procedure Notes (Signed)
Procedure Name: Intubation Date/Time: 03/25/2023 7:43 AM  Performed by: Jessica Priest, CRNAPre-anesthesia Checklist: Patient identified, Emergency Drugs available, Suction available, Patient being monitored and Timeout performed Patient Re-evaluated:Patient Re-evaluated prior to induction Oxygen Delivery Method: Circle system utilized Preoxygenation: Pre-oxygenation with 100% oxygen Induction Type: IV induction Ventilation: Mask ventilation without difficulty Laryngoscope Size: Mac and 3 Grade View: Grade I Tube type: Oral Tube size: 6.5 mm Number of attempts: 1 Airway Equipment and Method: Stylet and Oral airway Placement Confirmation: ETT inserted through vocal cords under direct vision, positive ETCO2, breath sounds checked- equal and bilateral and CO2 detector Secured at: 22 cm Tube secured with: Tape Dental Injury: Teeth and Oropharynx as per pre-operative assessment

## 2023-03-26 ENCOUNTER — Encounter (HOSPITAL_BASED_OUTPATIENT_CLINIC_OR_DEPARTMENT_OTHER): Payer: Self-pay | Admitting: Obstetrics and Gynecology

## 2023-03-26 LAB — SURGICAL PATHOLOGY

## 2023-04-01 ENCOUNTER — Ambulatory Visit (HOSPITAL_COMMUNITY)
Admission: EM | Admit: 2023-04-01 | Discharge: 2023-04-01 | Disposition: A | Discharge status: Home / Self Care | Payer: BC Managed Care – PPO

## 2023-04-01 NOTE — ED Notes (Signed)
Patient access reports patient thought we were open until 11:00.  Patient left the building without any explanation.

## 2023-04-12 ENCOUNTER — Encounter: Payer: Self-pay | Admitting: Family Medicine

## 2023-04-12 ENCOUNTER — Ambulatory Visit: Payer: BC Managed Care – PPO | Admitting: Family Medicine

## 2023-04-12 VITALS — BP 98/60 | HR 91 | Temp 97.8°F | Wt 105.6 lb

## 2023-04-12 DIAGNOSIS — J029 Acute pharyngitis, unspecified: Secondary | ICD-10-CM

## 2023-04-12 DIAGNOSIS — Z20828 Contact with and (suspected) exposure to other viral communicable diseases: Secondary | ICD-10-CM | POA: Diagnosis not present

## 2023-04-12 LAB — POCT RAPID STREP A (OFFICE): Rapid Strep A Screen: NEGATIVE

## 2023-04-12 LAB — POCT MONO (EPSTEIN BARR VIRUS): Mono, POC: NEGATIVE

## 2023-04-12 NOTE — Patient Instructions (Signed)
Consider retesting next week if you continue to have sore throat, develop increased fatigue, swollen lymph nodes in the neck, or fever.

## 2023-04-12 NOTE — Progress Notes (Signed)
   Established Patient Office Visit   Subjective  Patient ID: Abigail Benton, female    DOB: 1985-12-26  Age: 37 y.o. MRN: [id number]  Chief Complaint  Patient presents with   Sore Throat    ST started on 7/6, had been around the mono + person 4-5 days prior to her testing +    Patient is a 37 year old seen for acute concern.  Patient developed sore throat x 4 days.  Also notes ongoing fatigue status post ovarian cystectomy.  Denies other symptoms.  Patient concerned she may have mono and would like to get tested.  States she drank after her sister who recently tested positive for mono.  Sore Throat       ROS Negative unless stated above    Objective:     BP 98/60 (BP Location: Right Arm, Patient Position: Sitting, Cuff Size: Normal)   Pulse 91   Temp 97.8 F (36.6 C) (Temporal)   Wt 105 lb 9.6 oz (47.9 kg)   LMP 03/21/2023 (Approximate)   SpO2 95%   BMI 21.33 kg/m    Physical Exam Constitutional:      General: She is not in acute distress.    Appearance: Normal appearance.  HENT:     Head: Normocephalic and atraumatic.     Comments: Moderate cerumen impaction in bilateral canals.    Right Ear: Tympanic membrane normal.     Left Ear: Tympanic membrane normal.     Nose: Nose normal.     Mouth/Throat:     Mouth: Mucous membranes are moist.     Tonsils: No tonsillar exudate or tonsillar abscesses. 0 on the right. 0 on the left.  Cardiovascular:     Rate and Rhythm: Normal rate and regular rhythm.     Heart sounds: Normal heart sounds. No murmur heard.    No gallop.  Pulmonary:     Effort: Pulmonary effort is normal. No respiratory distress.     Breath sounds: Normal breath sounds. No wheezing, rhonchi or rales.  Lymphadenopathy:     Cervical: No cervical adenopathy.  Skin:    General: Skin is warm and dry.  Neurological:     Mental Status: She is alert and oriented to person, place, and time.     Results for orders placed or performed in visit on  04/12/23  POC Rapid Strep A  Result Value Ref Range   Rapid Strep A Screen Negative Negative  POC Mono (Epstein Barr Virus)  Result Value Ref Range   Mono, POC Negative Negative      Assessment & Plan:  Sore throat -     POCT rapid strep A -     POCT Mono (Epstein Barr Virus); Future  Exposure to mononucleosis syndrome   Patient presents with sore throat after exposure to mono.  Rapid strep testing negative in clinic.  Monospot test obtained from neighboring clinic as none available in clinic.  Monospot testing negative.  Discussed likely course of symptoms.  Supportive care.    Return if symptoms worsen or fail to improve.   Deeann Saint, MD

## 2023-09-16 ENCOUNTER — Encounter: Payer: Self-pay | Admitting: Family Medicine

## 2023-12-27 ENCOUNTER — Other Ambulatory Visit (HOSPITAL_COMMUNITY)
Admission: RE | Admit: 2023-12-27 | Discharge: 2023-12-27 | Disposition: A | Discharge status: Home / Self Care | Source: Ambulatory Visit | Attending: Family Medicine | Admitting: Family Medicine

## 2023-12-27 ENCOUNTER — Encounter: Payer: Self-pay | Admitting: Family Medicine

## 2023-12-27 ENCOUNTER — Ambulatory Visit: Admitting: Family Medicine

## 2023-12-27 VITALS — BP 98/66 | HR 115 | Temp 98.6°F | Ht 59.0 in | Wt 102.4 lb

## 2023-12-27 DIAGNOSIS — Z113 Encounter for screening for infections with a predominantly sexual mode of transmission: Secondary | ICD-10-CM | POA: Diagnosis present

## 2023-12-27 NOTE — Progress Notes (Signed)
 Established Patient Office Visit   Subjective  Patient ID: Abigail Benton, female    DOB: Jul 08, 1986  Age: 38 y.o. MRN: [id number]  Chief Complaint  Patient presents with   Exposure to STD    Testing     Patient is a 38 year old female seen for STI testing.  Patient denies symptoms.  Wanted to get tested due to a new partner.  Has well woman visit with OB/GYN next month.    Patient Active Problem List   Diagnosis Date Noted   UTI (urinary tract infection) 01/09/2014   Frequency 01/09/2014   Abnormal uterine bleeding 06/30/2012   Vaginitis 05/02/2012   Past Medical History:  Diagnosis Date   Anemia    H/O hematuria    H/O sickle cell trait    HSV-1 (herpes simplex virus 1) infection    Infection, respiratory    2011   Left breast mass    stable in 04/2011   Vulvovaginitis    Past Surgical History:  Procedure Laterality Date   LAPAROSCOPIC OVARIAN CYSTECTOMY Left 03/25/2023   Procedure: LAPAROSCOPIC UNILATERAL OVARIAN CYSTECTOMY;  Surgeon: Carlisle Cater, MD;  Location: [facility];  Service: Gynecology;  Laterality: Left;   TYMPANOSTOMY TUBE PLACEMENT     bilateral   wisdom teeth     Social History   Tobacco Use   Smoking status: Never   Smokeless tobacco: Never  Vaping Use   Vaping status: Never Used  Substance Use Topics   Alcohol use: No   Drug use: No   Family History  Problem Relation Age of Onset   Cancer Maternal Aunt        breast   Breast cancer Maternal Aunt 40   Cancer Maternal Grandmother        breast   Breast cancer Maternal Grandmother 39   Cancer Maternal Grandfather    Breast cancer Maternal Grandfather 80   Alcohol abuse Paternal Grandfather    Alcohol abuse Other    Allergies  Allergen Reactions   Macrobid [Nitrofurantoin Monohyd Macro]    Latex Rash      ROS Negative unless stated above    Objective:     BP 98/66 (BP Location: Left Arm, Patient Position: Sitting, Cuff Size: Normal)   Pulse  (!) 115   Temp 98.6 F (37 C) (Oral)   Ht 4\' 11"  (1.499 m)   Wt 102 lb 6.4 oz (46.4 kg)   LMP 12/12/2023 (Exact Date)   SpO2 97%   BMI 20.68 kg/m  BP Readings from Last 3 Encounters:  12/27/23 98/66  04/12/23 98/60  03/25/23 100/71   Wt Readings from Last 3 Encounters:  12/27/23 102 lb 6.4 oz (46.4 kg)  04/12/23 105 lb 9.6 oz (47.9 kg)  03/25/23 105 lb (47.6 kg)    Physical Exam Constitutional:      Appearance: Normal appearance.  HENT:     Head: Normocephalic and atraumatic.     Nose: Nose normal.     Mouth/Throat:     Mouth: Mucous membranes are moist.  Eyes:     Extraocular Movements: Extraocular movements intact.     Conjunctiva/sclera: Conjunctivae normal.     Pupils: Pupils are equal, round, and reactive to light.  Pulmonary:     Effort: Pulmonary effort is normal.  Genitourinary:    Comments: Aptima self swab collected. Musculoskeletal:        General: Normal range of motion.  Skin:    General: Skin is warm and dry.  Neurological:     Mental Status: She is alert and oriented to person, place, and time.      No results found for any visits on 12/27/23.    Assessment & Plan:  Routine screening for STI (sexually transmitted infection) -     HIV Antibody (routine testing w rflx) -     RPR -     Cervicovaginal ancillary only   Aptima self swab collected.  Will obtain blood work to screen for HIV and syphilis.  Discussed safe sex practices.  Return if symptoms worsen or fail to improve.   Deeann Saint, MD

## 2023-12-27 NOTE — Addendum Note (Signed)
 Addended by: Abbe Amsterdam R on: 12/27/2023 03:30 PM   Modules accepted: Level of Service

## 2023-12-28 LAB — HIV ANTIBODY (ROUTINE TESTING W REFLEX): HIV 1&2 Ab, 4th Generation: NONREACTIVE

## 2023-12-28 LAB — RPR: RPR Ser Ql: NONREACTIVE

## 2023-12-29 ENCOUNTER — Encounter: Payer: Self-pay | Admitting: Family Medicine

## 2023-12-29 LAB — CERVICOVAGINAL ANCILLARY ONLY
Bacterial Vaginitis (gardnerella): NEGATIVE
Candida Glabrata: NEGATIVE
Candida Vaginitis: NEGATIVE
Chlamydia: NEGATIVE
Comment: NEGATIVE
Comment: NEGATIVE
Comment: NEGATIVE
Comment: NEGATIVE
Comment: NEGATIVE
Comment: NORMAL
Neisseria Gonorrhea: NEGATIVE
Trichomonas: NEGATIVE

## 2024-08-21 ENCOUNTER — Ambulatory Visit: Admitting: Family Medicine

## 2024-08-23 ENCOUNTER — Ambulatory Visit: Admitting: Family Medicine

## 2024-08-23 ENCOUNTER — Encounter: Payer: Self-pay | Admitting: Family Medicine

## 2024-08-23 VITALS — BP 98/58 | HR 83 | Temp 98.3°F | Ht 59.0 in | Wt 104.6 lb

## 2024-08-23 DIAGNOSIS — R300 Dysuria: Secondary | ICD-10-CM

## 2024-08-23 DIAGNOSIS — N3001 Acute cystitis with hematuria: Secondary | ICD-10-CM

## 2024-08-23 DIAGNOSIS — R5383 Other fatigue: Secondary | ICD-10-CM

## 2024-08-23 DIAGNOSIS — R82998 Other abnormal findings in urine: Secondary | ICD-10-CM

## 2024-08-23 DIAGNOSIS — R3129 Other microscopic hematuria: Secondary | ICD-10-CM

## 2024-08-23 LAB — POC URINALSYSI DIPSTICK (AUTOMATED)
Bilirubin, UA: NEGATIVE
Blood, UA: POSITIVE
Glucose, UA: NEGATIVE
Ketones, UA: NEGATIVE
Leukocytes, UA: NEGATIVE
Nitrite, UA: NEGATIVE
Protein, UA: NEGATIVE
Spec Grav, UA: 1.015 (ref 1.010–1.025)
Urobilinogen, UA: 0.2 U/dL
pH, UA: 6 (ref 5.0–8.0)

## 2024-08-23 NOTE — Progress Notes (Signed)
 Established Patient Office Visit   Subjective  Patient ID: Abigail Benton, female    DOB: [birth year]  Age: 38 y.o. MRN: [id number]  Chief Complaint  Patient presents with   Acute Visit    Patient came in today for Dark urine, patient denies any pain, started 4 days ago, seen and [facility] on Monday, RX: Augmentin,     Pt is a 38 yo female seen for acute concern.  Pt with fatigue, malaise, and discolored urine.  Change in urine color started 1 wk ago.  Other symptoms developed including cramping after urination 3 days ago.      Patient Active Problem List   Diagnosis Date Noted   UTI (urinary tract infection) 01/09/2014   Frequency 01/09/2014   Abnormal uterine bleeding 06/30/2012   Vaginitis 05/02/2012   Past Medical History:  Diagnosis Date   Anemia    H/O hematuria    H/O sickle cell trait    HSV-1 (herpes simplex virus 1) infection    Infection, respiratory    2011   Left breast mass    stable in 04/2011   Vulvovaginitis    Past Surgical History:  Procedure Laterality Date   LAPAROSCOPIC OVARIAN CYSTECTOMY Left 03/25/2023   Procedure: LAPAROSCOPIC UNILATERAL OVARIAN CYSTECTOMY;  Surgeon: Sudie Lavonia HERO, MD;  Location: [facility];  Service: Gynecology;  Laterality: Left;   TYMPANOSTOMY TUBE PLACEMENT     bilateral   wisdom teeth     Social History   Tobacco Use   Smoking status: Never   Smokeless tobacco: Never  Vaping Use   Vaping status: Never Used  Substance Use Topics   Alcohol use: No   Drug use: No   Family History  Problem Relation Age of Onset   Cancer Maternal Aunt        breast   Breast cancer Maternal Aunt 40   Cancer Maternal Grandmother        breast   Breast cancer Maternal Grandmother 50   Cancer Maternal Grandfather    Breast cancer Maternal Grandfather 14   Alcohol abuse Paternal Grandfather    Alcohol abuse Other    Allergies  Allergen Reactions   Macrobid  [Nitrofurantoin  Monohyd Macro]    Latex  Rash    ROS Negative unless stated above    Objective:     BP (!) 98/58 (BP Location: Right Arm, Patient Position: Sitting, Cuff Size: Normal)   Pulse 83   Temp 98.3 F (36.8 C) (Oral)   Ht 4' 11 (1.499 m)   Wt 104 lb 9.6 oz (47.4 kg)   LMP 08/10/2024 (Approximate)   SpO2 99%   BMI 21.13 kg/m  BP Readings from Last 3 Encounters:  08/23/24 (!) 98/58  12/27/23 98/66  04/12/23 98/60   Wt Readings from Last 3 Encounters:  08/23/24 104 lb 9.6 oz (47.4 kg)  12/27/23 102 lb 6.4 oz (46.4 kg)  04/12/23 105 lb 9.6 oz (47.9 kg)      Physical Exam     12/27/2023    3:38 PM 11/26/2022    3:18 PM 08/21/2022    1:52 PM  Depression screen PHQ 2/9  Decreased Interest 0 0 0  Down, Depressed, Hopeless 0 0 0  PHQ - 2 Score 0 0 0  Altered sleeping 0    Tired, decreased energy 0    Change in appetite 0    Feeling bad or failure about yourself  0    Trouble concentrating 0  Moving slowly or fidgety/restless 0    Suicidal thoughts 0    PHQ-9 Score 0     Difficult doing work/chores Not difficult at all       Data saved with a previous flowsheet row definition      12/27/2023    3:38 PM  GAD 7 : Generalized Anxiety Score  Nervous, Anxious, on Edge 0  Control/stop worrying 0  Worry too much - different things 0  Trouble relaxing 0  Restless 0  Easily annoyed or irritable 0  Afraid - awful might happen 0  Total GAD 7 Score 0  Anxiety Difficulty Not difficult at all     Results for orders placed or performed in visit on 08/23/24  POCT Urinalysis Dipstick (Automated)  Result Value Ref Range   Color, UA Yellow    Clarity, UA Clear    Glucose, UA Negative Negative   Bilirubin, UA neg    Ketones, UA neg    Spec Grav, UA 1.015 1.010 - 1.025   Blood, UA pos    pH, UA 6.0 5.0 - 8.0   Protein, UA Negative Negative   Urobilinogen, UA 0.2 0.2 or 1.0 E.U./dL   Nitrite, UA neg    Leukocytes, UA Negative Negative      Assessment & Plan:   Acute cystitis with  hematuria  Dark urine -     POCT Urinalysis Dipstick (Automated) -     Urine Culture; Future -     Urine Microscopic; Future  Fatigue, unspecified type -     Urine Culture; Future  Other microscopic hematuria -     Urine Culture; Future -     Urine Microscopic; Future  Presumed acute cystitis with hematuria at min clinic visit.  Given Augmentin.  POC UA this visit with blood, otherwise negative.  Possibly due to abx use.  Given continued fatigue and malaise with some improvement in sx, continue abx.  Obtain UCx and micro.  Increase fluids.  Renal calculi less likely as without back pain or pain that moves.  Further recs based on results. Given strict precautions.  Return if symptoms worsen or fail to improve.   Clotilda JONELLE Single, MD

## 2024-08-24 LAB — URINALYSIS, MICROSCOPIC ONLY

## 2024-08-24 LAB — URINE CULTURE
MICRO NUMBER:: 17257491
Result:: NO GROWTH
SPECIMEN QUALITY:: ADEQUATE

## 2024-08-25 ENCOUNTER — Ambulatory Visit: Payer: Self-pay | Admitting: Family Medicine
